# Patient Record
Sex: Female | Born: 1965
Health system: Southern US, Community
[De-identification: ages and names within clinical notes are randomized; demographics above are authoritative.]

## PROBLEM LIST (undated history)

## (undated) DIAGNOSIS — M47816 Spondylosis without myelopathy or radiculopathy, lumbar region: Secondary | ICD-10-CM

## (undated) DIAGNOSIS — G43909 Migraine, unspecified, not intractable, without status migrainosus: Secondary | ICD-10-CM

## (undated) DIAGNOSIS — K219 Gastro-esophageal reflux disease without esophagitis: Secondary | ICD-10-CM

## (undated) DIAGNOSIS — I10 Essential (primary) hypertension: Secondary | ICD-10-CM

## (undated) DIAGNOSIS — E785 Hyperlipidemia, unspecified: Secondary | ICD-10-CM

## (undated) DIAGNOSIS — T7840XA Allergy, unspecified, initial encounter: Secondary | ICD-10-CM

## (undated) HISTORY — DX: Allergy, unspecified, initial encounter: T78.40XA

## (undated) HISTORY — DX: Hyperlipidemia, unspecified: E78.5

## (undated) HISTORY — DX: Spondylosis without myelopathy or radiculopathy, lumbar region: M47.816

## (undated) HISTORY — DX: Essential (primary) hypertension: I10

## (undated) HISTORY — DX: Gastro-esophageal reflux disease without esophagitis: K21.9

## (undated) HISTORY — DX: Migraine, unspecified, not intractable, without status migrainosus: G43.909

---

## 2017-02-28 ENCOUNTER — Observation Stay (HOSPITAL_COMMUNITY)
Admission: EM | Admit: 2017-02-28 | Discharge: 2017-03-01 | Disposition: A | Discharge status: Home / Self Care | Payer: BLUE CROSS/BLUE SHIELD | Attending: Internal Medicine | Admitting: Internal Medicine

## 2017-02-28 ENCOUNTER — Observation Stay (HOSPITAL_COMMUNITY): Payer: BLUE CROSS/BLUE SHIELD

## 2017-02-28 ENCOUNTER — Emergency Department (HOSPITAL_COMMUNITY): Payer: BLUE CROSS/BLUE SHIELD

## 2017-02-28 ENCOUNTER — Encounter: Payer: Self-pay | Admitting: Emergency Medicine

## 2017-02-28 DIAGNOSIS — E785 Hyperlipidemia, unspecified: Secondary | ICD-10-CM | POA: Insufficient documentation

## 2017-02-28 DIAGNOSIS — G43409 Hemiplegic migraine, not intractable, without status migrainosus: Secondary | ICD-10-CM | POA: Diagnosis not present

## 2017-02-28 DIAGNOSIS — I10 Essential (primary) hypertension: Secondary | ICD-10-CM | POA: Diagnosis not present

## 2017-02-28 DIAGNOSIS — Z7982 Long term (current) use of aspirin: Secondary | ICD-10-CM | POA: Insufficient documentation

## 2017-02-28 DIAGNOSIS — F1010 Alcohol abuse, uncomplicated: Secondary | ICD-10-CM | POA: Diagnosis not present

## 2017-02-28 DIAGNOSIS — E782 Mixed hyperlipidemia: Secondary | ICD-10-CM | POA: Diagnosis not present

## 2017-02-28 DIAGNOSIS — I639 Cerebral infarction, unspecified: Secondary | ICD-10-CM

## 2017-02-28 DIAGNOSIS — R531 Weakness: Secondary | ICD-10-CM | POA: Diagnosis present

## 2017-02-28 DIAGNOSIS — Z79899 Other long term (current) drug therapy: Secondary | ICD-10-CM | POA: Insufficient documentation

## 2017-02-28 DIAGNOSIS — G459 Transient cerebral ischemic attack, unspecified: Secondary | ICD-10-CM

## 2017-02-28 DIAGNOSIS — G43109 Migraine with aura, not intractable, without status migrainosus: Secondary | ICD-10-CM | POA: Diagnosis not present

## 2017-02-28 DIAGNOSIS — F17200 Nicotine dependence, unspecified, uncomplicated: Secondary | ICD-10-CM | POA: Diagnosis not present

## 2017-02-28 DIAGNOSIS — G8190 Hemiplegia, unspecified affecting unspecified side: Secondary | ICD-10-CM | POA: Diagnosis not present

## 2017-02-28 DIAGNOSIS — G8194 Hemiplegia, unspecified affecting left nondominant side: Secondary | ICD-10-CM | POA: Diagnosis not present

## 2017-02-28 LAB — COMPREHENSIVE METABOLIC PANEL
ALBUMIN: 4.6 g/dL (ref 3.5–5.0)
ALK PHOS: 58 U/L (ref 38–126)
ALT: 26 U/L (ref 14–54)
AST: 23 U/L (ref 15–41)
Anion gap: 11 (ref 5–15)
BUN: 10 mg/dL (ref 6–20)
CALCIUM: 9.6 mg/dL (ref 8.9–10.3)
CO2: 26 mmol/L (ref 22–32)
CREATININE: 0.74 mg/dL (ref 0.44–1.00)
Chloride: 100 mmol/L — ABNORMAL LOW (ref 101–111)
GFR calc Af Amer: >60 mL/min (ref >60)
GFR calc non Af Amer: >60 mL/min (ref >60)
GLUCOSE: 101 mg/dL — AB (ref 65–99)
Potassium: 4 mmol/L (ref 3.5–5.1)
SODIUM: 137 mmol/L (ref 135–145)
Total Bilirubin: 0.5 mg/dL (ref 0.3–1.2)
Total Protein: 7.4 g/dL (ref 6.5–8.1)

## 2017-02-28 LAB — URINALYSIS, ROUTINE W REFLEX MICROSCOPIC
BILIRUBIN URINE: NEGATIVE
Glucose, UA: NEGATIVE mg/dL
Hgb urine dipstick: NEGATIVE
KETONES UR: NEGATIVE mg/dL
Leukocytes, UA: NEGATIVE
NITRITE: NEGATIVE
PH: 7 (ref 5.0–8.0)
Protein, ur: NEGATIVE mg/dL
Specific Gravity, Urine: 1.004 — ABNORMAL LOW (ref 1.005–1.030)

## 2017-02-28 LAB — DIFFERENTIAL
Basophils Absolute: 0 10*3/uL (ref 0.0–0.1)
Basophils Relative: 1 %
Eosinophils Absolute: 0.2 10*3/uL (ref 0.0–0.7)
Eosinophils Relative: 3 %
LYMPHS PCT: 46 %
Lymphs Abs: 2.9 10*3/uL (ref 0.7–4.0)
Monocytes Absolute: 0.5 10*3/uL (ref 0.1–1.0)
Monocytes Relative: 8 %
NEUTROS ABS: 2.7 10*3/uL (ref 1.7–7.7)
NEUTROS PCT: 42 %

## 2017-02-28 LAB — CBC
HCT: 45.7 % (ref 36.0–46.0)
Hemoglobin: 15.8 g/dL — ABNORMAL HIGH (ref 12.0–15.0)
MCH: 31.8 pg (ref 26.0–34.0)
MCHC: 34.6 g/dL (ref 30.0–36.0)
MCV: 92 fL (ref 78.0–100.0)
PLATELETS: 237 10*3/uL (ref 150–400)
RBC: 4.97 MIL/uL (ref 3.87–5.11)
RDW: 12.2 % (ref 11.5–15.5)
WBC: 6.4 10*3/uL (ref 4.0–10.5)

## 2017-02-28 LAB — I-STAT CHEM 8, ED
BUN: 9 mg/dL (ref 6–20)
CALCIUM ION: 1.18 mmol/L (ref 1.15–1.40)
CREATININE: 0.7 mg/dL (ref 0.44–1.00)
Chloride: 103 mmol/L (ref 101–111)
Glucose, Bld: 99 mg/dL (ref 65–99)
HEMATOCRIT: 46 % (ref 36.0–46.0)
HEMOGLOBIN: 15.6 g/dL — AB (ref 12.0–15.0)
Potassium: 3.9 mmol/L (ref 3.5–5.1)
SODIUM: 139 mmol/L (ref 135–145)
TCO2: 26 mmol/L (ref 22–32)

## 2017-02-28 LAB — URINALYSIS, COMPLETE (UACMP) WITH MICROSCOPIC
BACTERIA UA: NONE SEEN
BILIRUBIN URINE: NEGATIVE
Glucose, UA: NEGATIVE mg/dL
HGB URINE DIPSTICK: NEGATIVE
Ketones, ur: NEGATIVE mg/dL
Leukocytes, UA: NEGATIVE
NITRITE: NEGATIVE
PROTEIN: NEGATIVE mg/dL
Specific Gravity, Urine: 1.006 (ref 1.005–1.030)
pH: 8 (ref 5.0–8.0)

## 2017-02-28 LAB — RAPID URINE DRUG SCREEN, HOSP PERFORMED
AMPHETAMINES: NOT DETECTED
Barbiturates: NOT DETECTED
Benzodiazepines: NOT DETECTED
Cocaine: NOT DETECTED
OPIATES: NOT DETECTED
TETRAHYDROCANNABINOL: NOT DETECTED

## 2017-02-28 LAB — I-STAT TROPONIN, ED: Troponin i, poc: 0 ng/mL (ref 0.00–0.08)

## 2017-02-28 LAB — ETHANOL: Alcohol, Ethyl (B): <10 mg/dL (ref <10)

## 2017-02-28 LAB — PROTIME-INR
INR: 0.95
PROTHROMBIN TIME: 12.6 s (ref 11.4–15.2)

## 2017-02-28 LAB — APTT: aPTT: 26 seconds (ref 24–36)

## 2017-02-28 LAB — I-STAT BETA HCG BLOOD, ED (MC, WL, AP ONLY): I-stat hCG, quantitative: 6 m[IU]/mL — ABNORMAL HIGH (ref <5)

## 2017-02-28 MED ORDER — ASPIRIN 300 MG RE SUPP: 300.0000 mg | Freq: Every day | RECTAL | Status: DC

## 2017-02-28 MED ORDER — THIAMINE HCL 100 MG/ML IJ SOLN: 100.0000 mg | Freq: Every day | INTRAMUSCULAR | Status: DC

## 2017-02-28 MED ORDER — VITAMIN B-1 100 MG PO TABS
100.0000 mg | ORAL_TABLET | Freq: Every day | ORAL | Status: DC
  Administered 2017-02-28 – 2017-03-01 (×2): 100 mg via ORAL
  Filled 2017-02-28 (×2): qty 1

## 2017-02-28 MED ORDER — LORATADINE 10 MG PO TABS
10.0000 mg | ORAL_TABLET | Freq: Every day | ORAL | Status: DC
  Administered 2017-02-28 – 2017-03-01 (×2): 10 mg via ORAL
  Filled 2017-02-28 (×2): qty 1

## 2017-02-28 MED ORDER — SENNOSIDES-DOCUSATE SODIUM 8.6-50 MG PO TABS: 1.0000 | ORAL_TABLET | Freq: Every evening | ORAL | Status: DC | PRN

## 2017-02-28 MED ORDER — HYDRALAZINE HCL 20 MG/ML IJ SOLN: 10.0000 mg | Freq: Four times a day (QID) | INTRAMUSCULAR | Status: DC | PRN

## 2017-02-28 MED ORDER — ACETAMINOPHEN 325 MG PO TABS
650.0000 mg | ORAL_TABLET | ORAL | Status: DC | PRN
  Administered 2017-02-28 – 2017-03-01 (×2): 650 mg via ORAL
  Filled 2017-02-28 (×2): qty 2

## 2017-02-28 MED ORDER — FOLIC ACID 1 MG PO TABS
1.0000 mg | ORAL_TABLET | Freq: Every day | ORAL | Status: DC
  Administered 2017-02-28 – 2017-03-01 (×2): 1 mg via ORAL
  Filled 2017-02-28 (×2): qty 1

## 2017-02-28 MED ORDER — ASPIRIN 325 MG PO TABS: 325.0000 mg | ORAL_TABLET | Freq: Every day | ORAL | Status: DC

## 2017-02-28 MED ORDER — ENOXAPARIN SODIUM 40 MG/0.4ML ~~LOC~~ SOLN
40.0000 mg | SUBCUTANEOUS | Status: DC
  Administered 2017-02-28: 40 mg via SUBCUTANEOUS
  Filled 2017-02-28: qty 0.4

## 2017-02-28 MED ORDER — HYDRALAZINE HCL 20 MG/ML IJ SOLN
5.0000 mg | Freq: Once | INTRAMUSCULAR | Status: AC
  Administered 2017-02-28: 5 mg via INTRAVENOUS
  Filled 2017-02-28: qty 1

## 2017-02-28 MED ORDER — ASPIRIN 325 MG PO TABS
325.0000 mg | ORAL_TABLET | Freq: Every day | ORAL | Status: DC
  Administered 2017-02-28 – 2017-03-01 (×2): 325 mg via ORAL
  Filled 2017-02-28 (×2): qty 1

## 2017-02-28 MED ORDER — LORAZEPAM 2 MG/ML IJ SOLN: 1.0000 mg | Freq: Four times a day (QID) | INTRAMUSCULAR | Status: DC | PRN

## 2017-02-28 MED ORDER — PANTOPRAZOLE SODIUM 40 MG PO TBEC
40.0000 mg | DELAYED_RELEASE_TABLET | Freq: Every day | ORAL | Status: DC
  Administered 2017-03-01: 40 mg via ORAL
  Filled 2017-02-28 (×2): qty 1

## 2017-02-28 MED ORDER — ADULT MULTIVITAMIN W/MINERALS CH
1.0000 | ORAL_TABLET | Freq: Every day | ORAL | Status: DC
  Administered 2017-02-28 – 2017-03-01 (×2): 1 via ORAL
  Filled 2017-02-28 (×2): qty 1

## 2017-02-28 MED ORDER — ACETAMINOPHEN 160 MG/5ML PO SOLN: 650.0000 mg | ORAL | Status: DC | PRN

## 2017-02-28 MED ORDER — ACETAMINOPHEN 650 MG RE SUPP: 650.0000 mg | RECTAL | Status: DC | PRN

## 2017-02-28 MED ORDER — LORAZEPAM 1 MG PO TABS: 1.0000 mg | ORAL_TABLET | Freq: Four times a day (QID) | ORAL | Status: DC | PRN

## 2017-02-28 MED ORDER — STROKE: EARLY STAGES OF RECOVERY BOOK
Freq: Once | Status: AC
  Administered 2017-02-28: 17:00:00
  Filled 2017-02-28: qty 1

## 2017-02-28 MED ORDER — SODIUM CHLORIDE 0.9 % IV SOLN
INTRAVENOUS | Status: DC
  Administered 2017-02-28: 18:00:00 via INTRAVENOUS

## 2017-02-28 MED ORDER — LORAZEPAM 2 MG/ML IJ SOLN: 2.0000 mg | Freq: Once | INTRAMUSCULAR | Status: DC

## 2017-02-28 MED ORDER — MELATONIN 3 MG PO TABS: 3.0000 mg | ORAL_TABLET | Freq: Every day | ORAL | Status: DC

## 2017-02-28 MED ORDER — ASPIRIN 325 MG PO TABS: 325.0000 mg | ORAL_TABLET | Freq: Once | ORAL | Status: DC

## 2017-02-28 NOTE — ED Notes (Signed)
Gave patient drink as requested and approved by Dr Estell HarpinZammit.

## 2017-02-28 NOTE — Progress Notes (Signed)

## 2017-02-28 NOTE — ED Notes (Signed)
Patient complains of headache. Gave cool rag, let the back of the bed down, and cut off lights as requested by patient.

## 2017-02-28 NOTE — H&P (Signed)
History and Physical  Alexis [name] BJY:[id number] DOB: 1966/01/03 DOA: 02/28/2017   PCP: Patient, No Pcp Per   Patient coming from: Home  Chief Complaint: left arm heaviness  HPI:  Alexis [name] is a 51 y.o. female with no documented past medical history presented with left arm heaviness and numbness from her shoulder to her fingers that started around 10:30 AM on February 28, 2017.  The patient was at work sitting at her desk at the time.  This was associated with an occipital headache.  The patient tried to take some Excedrin without relief.  The patient had some dizziness, but denied any fevers, chills, chest pain, shortness breath, nausea, vomiting.  The patient also complained of some blurry vision, but she did denied dysphasia, dysarthria.  The patient also endorsed "wobbly legs".  She stated that it felt like her left leg was a little weak.  The patient actually drove home and told her significant other about her symptoms.  Subsequently, she was driven by private vehicle to the emergency department around 1 PM.  In the emergency department, the patient stated that her left arm heaviness was 50% better.  She still had difficulty ambulating secondary to her left leg feeling wobbly and weak. Teleneurology recommended admission for stroke work up, but did not feel patient was a tPA candidate. She has not seen a physician in over 2 years, and she states that she does not take any prescription medications.  She drinks 1 beer at a glass of wine daily.  She denies any illegal drug use or tobacco.  She has approximately a 5-pack-year history of tobacco.  She denies any dysuria, hematuria, hematochezia, melena, coughing, hemoptysis, abdominal pain.  In the emergency department, the patient was afebrile hemodynamically stable saturating 97% on room air.  Initial blood pressure was 183/125.  BMP, LFTs, and CBC were unremarkable.  CT of the brain was unremarkable.  EKG showed sinus rhythm with  nonspecific T wave changes.  In the emergency department, the patient was given aspirin 325 mg and hydralazine 5 mg IV  Assessment/Plan: Left hemiparesis -concerned about stroke vs complicated migraine -PT/OT evaluation -Speech therapy eval -CT brain-- -MRI brain-- -MRA brain-- -Carotid Duplex-- -Echo-- -LDL-- -HbA1C-- -Antiplatelet--ASA 325 mg daily  Essential hypertension -Allow permissive hypertension in the first 24 hours -Hydralazine prn SBP >220 -Urine drug screen--negative  Habitual Alcohol use -CIWA           History reviewed. No pertinent past medical history. History reviewed. No pertinent surgical history. Social History:  reports that she has never smoked. She has never used smokeless tobacco. She reports that she drinks alcohol. She reports that she does not use drugs.   Family history- sister and mother had stroke  Allergies  Allergen Reactions  . Codeine Hives  . Penicillins Hives    Has patient had a PCN reaction causing immediate rash, facial/tongue/throat swelling, SOB or lightheadedness with hypotension: Yes Has patient had a PCN reaction causing severe rash involving mucus membranes or skin necrosis: No Has patient had a PCN reaction that required hospitalization: No Has patient had a PCN reaction occurring within the last 10 years: No If all of the above answers are "NO", then may proceed with Cephalosporin use.   . Tramadol Itching  . Azithromycin Hives  . Tetracyclines & Related Nausea And Vomiting     Prior to Admission medications   Medication Sig Start Date End Date Taking? Authorizing Provider  fexofenadine (ALLEGRA) 180 MG tablet  Take 180 mg by mouth daily as needed for allergies.   Yes [provider]  Lactobacillus (PROBIOTIC ACIDOPHILUS PO) Take 1 capsule by mouth daily.   Yes [provider]  Melatonin 3 MG TABS Take 3 mg by mouth at bedtime.   Yes [provider]  omeprazole (PRILOSEC) 20 MG  capsule Take 20 mg by mouth daily.   Yes [provider]    Review of Systems:  Constitutional:  No weight loss, night sweats, Fevers, chills, fatigue.  Head&Eyes: No headache.  No vision loss.  No eye pain or scotoma ENT:  No Difficulty swallowing,Tooth/dental problems,Sore throat,  No ear ache, post nasal drip,  Cardio-vascular:  No chest pain, Orthopnea, PND, swelling in lower extremities,  dizziness, palpitations  GI:  No  abdominal pain, nausea, vomiting, diarrhea, loss of appetite, hematochezia, melena, heartburn, indigestion, Resp:  No shortness of breath with exertion or at rest. No cough. No coughing up of blood .No wheezing.No chest wall deformity  Skin:  no rash or lesions.  GU:  no dysuria, change in color of urine, no urgency or frequency. No flank pain.  Musculoskeletal:  No joint pain or swelling. No decreased range of motion. No back pain.  Psych:  No change in mood or affect. No depression or anxiety. Neurologic:  no vision loss. No syncope  Physical Exam: Vitals:   02/28/17 1345 02/28/17 1400 02/28/17 1445 02/28/17 1500  BP: (!) 147/110 (!) 166/117 (!) 160/108 (!) 154/105  Pulse: 82 80 96 86  Resp: (!) 21 11 (!) 21 (!) 23  Temp:      TempSrc:      SpO2: 94% 96% 97% 97%  Weight:      Height:       General:  A&O x 3, NAD, nontoxic, pleasant/cooperative Head/Eye: No conjunctival hemorrhage, no icterus, [place]/AT, No nystagmus ENT:  No icterus,  No thrush, good dentition, no pharyngeal exudate Neck:  No masses, no lymphadenpathy, no bruits CV:  RRR, no rub, no gallop, no S3 Lung:  CTAB, good air movement, no wheeze, no rhonchi Abdomen: soft/NT, +BS, nondistended, no peritoneal signs Ext: No cyanosis, No rashes, No petechiae, No lymphangitis, No edema Neuro: CNII-XII intact, strength 4-/5 in bilateral upper, 4-/5 LLE, 4+/5 RLE; no dysmetria  Labs on Admission:  Basic Metabolic Panel:  Recent Labs Lab 02/28/17 1304 02/28/17 1312  NA 137 139    K 4.0 3.9  CL 100* 103  CO2 26  --   GLUCOSE 101* 99  BUN 10 9  CREATININE 0.74 0.70  CALCIUM 9.6  --    Liver Function Tests:  Recent Labs Lab 02/28/17 1304  AST 23  ALT 26  ALKPHOS 58  BILITOT 0.5  PROT 7.4  ALBUMIN 4.6   No results for input(s): LIPASE, AMYLASE in the last 168 hours. No results for input(s): AMMONIA in the last 168 hours. CBC:  Recent Labs Lab 02/28/17 1304 02/28/17 1312  WBC 6.4  --   NEUTROABS 2.7  --   HGB 15.8* 15.6*  HCT 45.7 46.0  MCV 92.0  --   PLT 237  --    Coagulation Profile:  Recent Labs Lab 02/28/17 1304  INR 0.95   Cardiac Enzymes: No results for input(s): CKTOTAL, CKMB, CKMBINDEX, TROPONINI in the last 168 hours. BNP: Invalid input(s): POCBNP CBG: No results for input(s): GLUCAP in the last 168 hours. Urine analysis: No results found for: COLORURINE, APPEARANCEUR, LABSPEC, PHURINE, GLUCOSEU, HGBUR, BILIRUBINUR, KETONESUR, PROTEINUR, UROBILINOGEN, NITRITE, LEUKOCYTESUR Sepsis  Labs: @LABRCNTIP (procalcitonin:4,lacticidven:4) )No results found for this or any previous visit (from the past 240 hour(s)).   Radiological Exams on Admission: Ct Head Code Stroke Wo Contrast  Result Date: 02/28/2017 CLINICAL DATA:  Code stroke. New onset of left arm numbness and left lower extremity weakness. Lightheadedness and nausea. Symptoms began 2.5 hours ago. EXAM: CT HEAD WITHOUT CONTRAST TECHNIQUE: Contiguous axial images were obtained from the base of the skull through the vertex without intravenous contrast. COMPARISON:  None. FINDINGS: Brain: No acute infarct, hemorrhage, or mass lesion is present. The ventricles are of normal size. No significant extraaxial fluid collection is present. No significant white matter disease is present. The brainstem and cerebellum are normal. Vascular: No hyperdense vessel or unexpected calcification. Skull: No focal lytic or blastic lesions are present. The calvarium is intact. Sinuses/Orbits: The  paranasal sinuses and mastoid air cells are clear. The globes and orbits are within normal limits. ASPECTS [facility] Stroke Program Early CT Score) - Ganglionic level infarction (caudate, lentiform nuclei, internal capsule, insula, M1-M3 cortex): 7/7 - Supraganglionic infarction (M4-M6 cortex): 3/3 Total score (0-10 with 10 being normal): 10/10 IMPRESSION: 1. Negative CT of the head 2. ASPECTS is 10/10 These results were called by telephone at the time of interpretation on 02/28/2017 at 1:24 pm to Dr. Bethann Berkshire , who verbally acknowledged these results. Electronically Signed   By: Marin Roberts M.D.   On: 02/28/2017 13:25    EKG: Independently reviewed.  Sinus rhythm, nonspecific T wave change    Time spent:60 minutes Code Status:   FULL Family Communication:  No Family at bedside Disposition Plan: expect 1-2 day hospitalization Consults called: neurology DVT Prophylaxis: [place] Lovenox  Watson Robarge, DO  [facility] Pager [phone]  If 7PM-7AM, please contact night-coverage [email or web address] Password [email or web address] 02/28/2017, 3:17 PM

## 2017-02-28 NOTE — Progress Notes (Signed)
CODE STROKE 1255 CALL TIME 1305 BEEPER TIME 1307 EXAM STARTED 1309 EXAM FINISHED 1313 IMAGES SENT TO SOC 1314 EXAM COMPLETED IN EPIC 1314 [facility] CALLED

## 2017-02-28 NOTE — ED Notes (Signed)
Patient states she took 2 excedrin migraine medication this morning at 0930. Patient had bottle with her and it states the medication contains 250 mg of aspirin per tablet. Per Dr Aileen PilotZammitt, patient not to be given 325 mg aspirin.

## 2017-02-28 NOTE — ED Notes (Signed)
Patient ambulated to bathroom with this nurse at side. Patient ambulated with no assistance or difficulty. States "It's better but I can tell it's not 100% yet." Patient returned to room and reconnected to monitor. Spouse remains at bedside.

## 2017-02-28 NOTE — ED Provider Notes (Signed)
[facility] Provider Note   CSN: [id number] Arrival date & time: 02/28/17  1247   An emergency department physician performed an initial assessment on this suspected stroke patient at 1305.  History   Chief Complaint Chief Complaint  Patient presents with  . Code Stroke    HPI Alexis [name] is a 51 y.o. female.  Patient states that around 1030 this morning she started with weakness to the left upper extremity.  She also stated that she felt like she was walking little bit wobbly   The history is provided by the patient. No language interpreter was used.  Weakness  Primary symptoms include focal weakness. This is a new problem. The current episode started 1 to 2 hours ago. The problem has not changed since onset.There was left lower extremity and left upper extremity focality noted. There has been no fever. Pertinent negatives include no shortness of breath, no chest pain and no headaches. There were no medications administered prior to arrival. Associated medical issues do not include trauma.    History reviewed. No pertinent past medical history.  Patient Active Problem List   Diagnosis Date Noted  . Left hemiparesis (HCC) 02/28/2017    History reviewed. No pertinent surgical history.  OB History    No data available       Home Medications    Prior to Admission medications   Medication Sig Start Date End Date Taking? Authorizing Provider  fexofenadine (ALLEGRA) 180 MG tablet Take 180 mg by mouth daily as needed for allergies.   Yes [provider]  Lactobacillus (PROBIOTIC ACIDOPHILUS PO) Take 1 capsule by mouth daily.   Yes [provider]  Melatonin 3 MG TABS Take 3 mg by mouth at bedtime.   Yes [provider]  omeprazole (PRILOSEC) 20 MG capsule Take 20 mg by mouth daily.   Yes [provider]    Family History No family history on file.  Social History Social History  Substance Use Topics  .  Smoking status: Never Smoker  . Smokeless tobacco: Never Used  . Alcohol use Yes     Comment: daily      Allergies   Codeine; Penicillins; Tramadol; Azithromycin; and Tetracyclines & related   Review of Systems Review of Systems  Constitutional: Negative for appetite change and fatigue.  HENT: Negative for congestion, ear discharge and sinus pressure.   Eyes: Negative for discharge.  Respiratory: Negative for cough and shortness of breath.   Cardiovascular: Negative for chest pain.  Gastrointestinal: Negative for abdominal pain and diarrhea.  Genitourinary: Negative for frequency and hematuria.  Musculoskeletal: Negative for back pain.  Skin: Negative for rash.  Neurological: Positive for focal weakness and weakness. Negative for seizures and headaches.  Psychiatric/Behavioral: Negative for hallucinations.     Physical Exam Updated Vital Signs BP (!) 154/105   Pulse 86   Temp 97.6 F (36.4 C)   Resp (!) 23   Ht 5\' 4"  (1.626 m)   Wt 68 kg (150 lb)   SpO2 97%   BMI 25.75 kg/m   Physical Exam  Constitutional: She is oriented to person, place, and time. She appears well-developed.  HENT:  Head: Normocephalic.  Eyes: Conjunctivae and EOM are normal. No scleral icterus.  Neck: Neck supple. No thyromegaly present.  Cardiovascular: Normal rate and regular rhythm.  Exam reveals no gallop and no friction rub.   No murmur heard. Pulmonary/Chest: No stridor. She has no wheezes. She has no rales. She exhibits no tenderness.  Abdominal: She exhibits no distension. There is no tenderness. There is no rebound.  Musculoskeletal: Normal range of motion. She exhibits no edema.  Patient has mild decreased sensation to the left forearm also mild ataxia when walking heel to toe on the left leg  Lymphadenopathy:    She has no cervical adenopathy.  Neurological: She is oriented to person, place, and time. She exhibits normal muscle tone. Coordination normal.  Skin: No rash noted. No  erythema.  Psychiatric: She has a normal mood and affect. Her behavior is normal.     ED Treatments / Results  Labs (all labs ordered are listed, but only abnormal results are displayed) Labs Reviewed  CBC - Abnormal; Notable for the following:       Result Value   Hemoglobin 15.8 (*)    All other components within normal limits  COMPREHENSIVE METABOLIC PANEL - Abnormal; Notable for the following:    Chloride 100 (*)    Glucose, Bld 101 (*)    All other components within normal limits  I-STAT CHEM 8, ED - Abnormal; Notable for the following:    Hemoglobin 15.6 (*)    All other components within normal limits  I-STAT BETA HCG BLOOD, ED ([facility], [facility], [facility] ONLY) - Abnormal; Notable for the following:    I-stat hCG, quantitative 6.0 (*)    All other components within normal limits  ETHANOL  PROTIME-INR  APTT  DIFFERENTIAL  RAPID URINE DRUG SCREEN, HOSP PERFORMED  URINALYSIS, ROUTINE W REFLEX MICROSCOPIC  I-STAT TROPONIN, ED    EKG  EKG Interpretation None       Radiology Ct Head Code Stroke Wo Contrast  Result Date: 02/28/2017 CLINICAL DATA:  Code stroke. New onset of left arm numbness and left lower extremity weakness. Lightheadedness and nausea. Symptoms began 2.5 hours ago. EXAM: CT HEAD WITHOUT CONTRAST TECHNIQUE: Contiguous axial images were obtained from the base of the skull through the vertex without intravenous contrast. COMPARISON:  None. FINDINGS: Brain: No acute infarct, hemorrhage, or mass lesion is present. The ventricles are of normal size. No significant extraaxial fluid collection is present. No significant white matter disease is present. The brainstem and cerebellum are normal. Vascular: No hyperdense vessel or unexpected calcification. Skull: No focal lytic or blastic lesions are present. The calvarium is intact. Sinuses/Orbits: The paranasal sinuses and mastoid air cells are clear. The globes and orbits are within normal limits. ASPECTS [facility] Stroke Program  Early CT Score) - Ganglionic level infarction (caudate, lentiform nuclei, internal capsule, insula, M1-M3 cortex): 7/7 - Supraganglionic infarction (M4-M6 cortex): 3/3 Total score (0-10 with 10 being normal): 10/10 IMPRESSION: 1. Negative CT of the head 2. ASPECTS is 10/10 These results were called by telephone at the time of interpretation on 02/28/2017 at 1:24 pm to Dr. Bethann Berkshire , who verbally acknowledged these results. Electronically Signed   By: Marin Roberts M.D.   On: 02/28/2017 13:25    Procedures Procedures (including critical care time)  Medications Ordered in ED Medications  aspirin tablet 325 mg (325 mg Oral Not Given 02/28/17 1342)  hydrALAZINE (APRESOLINE) injection 5 mg (5 mg Intravenous Given 02/28/17 1457)     Initial Impression / Assessment and Plan / ED Course  I have reviewed the triage vital signs and the nursing notes.  Pertinent labs & imaging results that were available during my care of the patient were reviewed by me and considered in my medical decision making (see chart for details). CRITICAL CARE Performed by: Ardra Kuznicki L Total critical  care time: 40 minutes Critical care time was exclusive of separately billable procedures and treating other patients. Critical care was necessary to treat or prevent imminent or life-threatening deterioration. Critical care was time spent personally by me on the following activities: development of treatment plan with patient and/or surrogate as well as nursing, discussions with consultants, evaluation of patient's response to treatment, examination of patient, obtaining history from patient or surrogate, ordering and performing treatments and interventions, ordering and review of laboratory studies, ordering and review of radiographic studies, pulse oximetry and re-evaluation of patient's condition.     Neurology was consulted.  It was felt by neurology that the patient did not warrant TPA and they recommended  admission for stroke workup Final Clinical Impressions(s) / ED Diagnoses   Final diagnoses:  TIA (transient ischemic attack)  Left-sided weakness    New Prescriptions New Prescriptions   No medications on file     Bethann Berkshire, MD 02/28/17 1520

## 2017-02-28 NOTE — ED Notes (Signed)
Patient receiving teleneuro consult.

## 2017-02-28 NOTE — ED Notes (Signed)
Patient returned from CT

## 2017-02-28 NOTE — ED Notes (Signed)
Per Dr Estell HarpinZammit, patient not a candidate for TPA, code stroke ended at this time.

## 2017-02-28 NOTE — ED Notes (Signed)
Patient to CT.

## 2017-02-28 NOTE — ED Triage Notes (Signed)
Pt c/o left arm numbness, left leg feeling "wobbly", lightheadedness, nausea that started between 1030 and 1100 this morning. Dr. Estell HarpinZammit notified.

## 2017-03-01 ENCOUNTER — Encounter: Payer: Self-pay | Admitting: Neurology

## 2017-03-01 ENCOUNTER — Observation Stay (HOSPITAL_COMMUNITY): Payer: BLUE CROSS/BLUE SHIELD

## 2017-03-01 ENCOUNTER — Observation Stay (HOSPITAL_BASED_OUTPATIENT_CLINIC_OR_DEPARTMENT_OTHER): Payer: BLUE CROSS/BLUE SHIELD

## 2017-03-01 DIAGNOSIS — G8190 Hemiplegia, unspecified affecting unspecified side: Secondary | ICD-10-CM | POA: Diagnosis not present

## 2017-03-01 DIAGNOSIS — G43409 Hemiplegic migraine, not intractable, without status migrainosus: Secondary | ICD-10-CM | POA: Diagnosis not present

## 2017-03-01 DIAGNOSIS — I10 Essential (primary) hypertension: Secondary | ICD-10-CM

## 2017-03-01 DIAGNOSIS — E782 Mixed hyperlipidemia: Secondary | ICD-10-CM

## 2017-03-01 DIAGNOSIS — G43109 Migraine with aura, not intractable, without status migrainosus: Secondary | ICD-10-CM

## 2017-03-01 DIAGNOSIS — F1010 Alcohol abuse, uncomplicated: Secondary | ICD-10-CM | POA: Diagnosis not present

## 2017-03-01 DIAGNOSIS — G8194 Hemiplegia, unspecified affecting left nondominant side: Secondary | ICD-10-CM | POA: Diagnosis not present

## 2017-03-01 LAB — ECHOCARDIOGRAM COMPLETE
CHL CUP DOP CALC LVOT VTI: 21.4 cm
CHL CUP STROKE VOLUME: 26 mL
EERAT: 5.52
EWDT: 180 ms
FS: 36 % (ref 28–44)
Height: 64 in
IV/PV OW: 0.91
LA vol A4C: 31.2 ml
LADIAMINDEX: 1.64 cm/m2
LASIZE: 30 mm
LDCA: 3.14 cm2
LEFT ATRIUM END SYS DIAM: 30 mm
LV E/e' medial: 5.52
LV E/e'average: 5.52
LV TDI E'MEDIAL: 7.07
LV dias vol index: 20 mL/m2
LV sys vol index: 6 mL/m2
LV sys vol: 11 mL — AB
LVDIAVOL: 37 mL — AB (ref 46–106)
LVELAT: 12.2 cm/s
LVOT SV: 67 mL
LVOT diameter: 20 mm
LVOT peak grad rest: 6 mmHg
LVOT peak vel: 118 cm/s
Lateral S' vel: 13.7 cm/s
MV Dec: 180
MVPKAVEL: 96.8 m/s
MVPKEVEL: 67.3 m/s
PW: 10 mm — AB (ref 0.6–1.1)
Simpson's disk: 70
TAPSE: 13.8 mm
TDI e' lateral: 12.2
Weight: 2567 oz

## 2017-03-01 LAB — HEMOGLOBIN A1C
Hgb A1c MFr Bld: 5.5 % (ref 4.8–5.6)
Mean Plasma Glucose: 111.15 mg/dL

## 2017-03-01 LAB — LIPID PANEL
CHOL/HDL RATIO: 5.5 ratio
Cholesterol: 253 mg/dL — ABNORMAL HIGH (ref 0–200)
HDL: 46 mg/dL (ref >40)
LDL CALC: UNDETERMINED mg/dL (ref 0–99)
TRIGLYCERIDES: 427 mg/dL — AB (ref <150)
VLDL: UNDETERMINED mg/dL (ref 0–40)

## 2017-03-01 MED ORDER — AMLODIPINE BESYLATE 10 MG PO TABS: 10.0000 mg | ORAL_TABLET | Freq: Every day | ORAL | 1 refills | Status: DC

## 2017-03-01 MED ORDER — FENOFIBRATE 160 MG PO TABS: 160.0000 mg | ORAL_TABLET | Freq: Every day | ORAL | 1 refills | Status: DC

## 2017-03-01 MED ORDER — ATORVASTATIN CALCIUM 20 MG PO TABS: 20.0000 mg | ORAL_TABLET | Freq: Every day | ORAL | 1 refills | Status: DC

## 2017-03-01 MED ORDER — AMLODIPINE BESYLATE 5 MG PO TABS
5.0000 mg | ORAL_TABLET | Freq: Every day | ORAL | Status: DC
  Administered 2017-03-01: 5 mg via ORAL
  Filled 2017-03-01: qty 1

## 2017-03-01 MED ORDER — ATORVASTATIN CALCIUM 20 MG PO TABS: 20.0000 mg | ORAL_TABLET | Freq: Every day | ORAL | Status: DC

## 2017-03-01 MED ORDER — ASPIRIN EC 81 MG PO TBEC: 81.0000 mg | DELAYED_RELEASE_TABLET | Freq: Every day | ORAL | 1 refills | Status: AC

## 2017-03-01 MED ORDER — FENOFIBRATE 160 MG PO TABS
160.0000 mg | ORAL_TABLET | Freq: Every day | ORAL | Status: DC
  Administered 2017-03-01: 160 mg via ORAL
  Filled 2017-03-01: qty 1

## 2017-03-01 MED ORDER — AMLODIPINE BESYLATE 5 MG PO TABS: 5.0000 mg | ORAL_TABLET | Freq: Every day | ORAL | 1 refills | Status: DC

## 2017-03-01 NOTE — Progress Notes (Signed)
Patient states understanding of discharge instructions, prescriptions given 

## 2017-03-01 NOTE — Progress Notes (Signed)
*  PRELIMINARY RESULTS* Echocardiogram 2D Echocardiogram has been performed.  Stacey DrainWhite, Zipporah Finamore J 03/01/2017, 11:46 AM

## 2017-03-01 NOTE — Care Management Note (Signed)
Case Management Note  Patient Details  Name: Alexis [name] MRN: [id number] Date of Birth: 07-21-1965  Subjective/Objective:           Admitted with left hemiparesis. CVA workup negative. Pt from home, ind, has insurance with drug coverage, no PCP. Pt has no PT/OT recommendations for DC. Pt has no PCP.          Action/Plan: DC home today. Pt given list of PCP in [place] accepting new pt's. Encouraged pt to establish care.   Expected Discharge Date:  03/02/17               Expected Discharge Plan:  Home/Self Care  In-House Referral:  NA  Discharge planning Services  CM Consult  Post Acute Care Choice:  NA Choice offered to:  NA  Status of Service:  Completed, signed off  Alexis [name], Girtie Wiersma Demske, RN 03/01/2017, 1:00 PM

## 2017-03-01 NOTE — Progress Notes (Signed)
Pt's BP was 149/100.  Dr Tat informed.  No new orders at this time.

## 2017-03-01 NOTE — Discharge Summary (Signed)
Physician Discharge Summary  Elmer SowMichele Farquharson WUJ:[id number] DOB: [birth year] DOA: 02/28/2017  PCP: Patient, No Pcp Per  Admit date: 02/28/2017 Discharge date: 03/01/2017  Admitted From: Home Disposition:  Home   Recommendations for Outpatient Follow-up:  1. Follow up with PCP in 1-2 weeks 2. Please obtain BMP/CBC in one week    Discharge Condition: Stable CODE STATUS: FULL Diet recommendation: Heart Healthy   Brief/Interim Summary: 51 y.o. female with no documented past medical history presented with left arm heaviness and numbness from her shoulder to her fingers that started around 10:30 AM on February 28, 2017.  The patient was at work sitting at her desk at the time.  This was associated with an occipital headache.  The patient tried to take some Excedrin without relief.  The patient had some dizziness, but denied any fevers, chills, chest pain, shortness breath, nausea, vomiting.  The patient also complained of some blurry vision, but she did denied dysphasia, dysarthria.  The patient also endorsed "wobbly legs".  She stated that it felt like her left leg was a little weak.  The patient actually drove home and told her significant other about her symptoms.  Subsequently, she was driven by private vehicle to the emergency department around 1 PM.  In the emergency department, the patient stated that her left arm heaviness was 50% better.  She still had difficulty ambulating secondary to her left leg feeling wobbly and weak. Teleneurology recommended admission for stroke work up, but did not feel patient was a tPA candidate. She has not seen a physician in over 2 years, and she states that she does not take any prescription medications.  She drinks 1 beer at a glass of wine daily.  She denies any illegal drug use or tobacco.  She has approximately a 5-pack-year history of tobacco.  She denies any dysuria, hematuria, hematochezia, melena, coughing, hemoptysis, abdominal pain.  In the  emergency department, the patient was afebrile hemodynamically stable saturating 97% on room air.  Initial blood pressure was 183/125.  BMP, LFTs, and CBC were unremarkable.  CT of the brain was unremarkable.  EKG showed sinus rhythm with nonspecific T wave changes.  In the emergency department, the patient was given aspirin 325 mg and hydralazine 5 mg IV  Discharge Diagnoses:  Complicated Migraine/Left hemiparesis -deficits completely resolved on am of discharge -PT/OT evaluation--no follow up needed -Speech therapy eval -CT brain--neg -MRI brain--neg -MRA brain--neg -Carotid Duplex--no hemodynamically significant stenosis -Echo--EF-65%, no WMA, grade 1 DD, no PFO -LDL--unable to calculate due to hypertryglyceridemia -HbA1C--5.5 -Antiplatelet--home with ASA 81 mg daily  Essential hypertension -Allow permissive hypertension in the first 24 hours -Hydralazine prn SBP >220 -Urine drug screen--negative -start amlodipine 10 mg daily  Habitual Alcohol use -CIWA -cessation discussed -no signs of withdrawal  Hyperlipidemia -fenofibrate for hypertriglyceridemia--427 -atorvastatin for hypercholesterolemia   Discharge Instructions  Discharge Instructions    Diet - low sodium heart healthy    Complete by:  As directed    Increase activity slowly    Complete by:  As directed      Allergies as of 03/01/2017      Reactions   Codeine Hives   Penicillins Hives   Has patient had a PCN reaction causing immediate rash, facial/tongue/throat swelling, SOB or lightheadedness with hypotension: Yes Has patient had a PCN reaction causing severe rash involving mucus membranes or skin necrosis: No Has patient had a PCN reaction that required hospitalization: No Has patient had a PCN reaction occurring within the last 10  years: No If all of the above answers are "NO", then may proceed with Cephalosporin use.   Tramadol Itching   Azithromycin Hives   Tetracyclines & Related Nausea And  Vomiting      Medication List    TAKE these medications   amLODipine 10 MG tablet Commonly known as:  NORVASC Take 1 tablet (10 mg total) by mouth daily.   aspirin EC 81 MG tablet Take 1 tablet (81 mg total) by mouth daily.   atorvastatin 20 MG tablet Commonly known as:  LIPITOR Take 1 tablet (20 mg total) by mouth daily at 6 PM.   fenofibrate 160 MG tablet Take 1 tablet (160 mg total) by mouth daily.   fexofenadine 180 MG tablet Commonly known as:  ALLEGRA Take 180 mg by mouth daily as needed for allergies.   Melatonin 3 MG Tabs Take 3 mg by mouth at bedtime.   omeprazole 20 MG capsule Commonly known as:  PRILOSEC Take 20 mg by mouth daily.   PROBIOTIC ACIDOPHILUS PO Take 1 capsule by mouth daily.       Allergies  Allergen Reactions  . Codeine Hives  . Penicillins Hives    Has patient had a PCN reaction causing immediate rash, facial/tongue/throat swelling, SOB or lightheadedness with hypotension: Yes Has patient had a PCN reaction causing severe rash involving mucus membranes or skin necrosis: No Has patient had a PCN reaction that required hospitalization: No Has patient had a PCN reaction occurring within the last 10 years: No If all of the above answers are "NO", then may proceed with Cephalosporin use.   . Tramadol Itching  . Azithromycin Hives  . Tetracyclines & Related Nausea And Vomiting    Consultations:  none   Procedures/Studies: Dg Chest 2 View  Result Date: 02/28/2017 CLINICAL DATA:  Preadmission radiograph for possible stroke. EXAM: CHEST  2 VIEW COMPARISON:  None. FINDINGS: The heart size and mediastinal contours are within normal limits. Both lungs are clear. The visualized skeletal structures are unremarkable. IMPRESSION: No active cardiopulmonary disease. Electronically Signed   By: Tollie Eth M.D.   On: 02/28/2017 19:56   Mr Brain Wo Contrast  Result Date: 02/28/2017 CLINICAL DATA:  51 y/o  F; left arm numbness. EXAM: MRI HEAD  WITHOUT CONTRAST MRA HEAD WITHOUT CONTRAST TECHNIQUE: Multiplanar, multiecho pulse sequences of the brain and surrounding structures were obtained without intravenous contrast. Angiographic images of the head were obtained using MRA technique without contrast. COMPARISON:  02/28/2017 CT of the head. FINDINGS: MRI HEAD FINDINGS Brain: No acute infarction, hemorrhage, hydrocephalus, extra-axial collection or mass lesion. Vascular: Normal flow voids. Skull and upper cervical spine: Normal marrow signal. Sinuses/Orbits: Negative. Other: None. MRA HEAD FINDINGS Anterior circulation: No large vessel occlusion, aneurysm, or significant stenosis is identified. Posterior circulation: No large vessel occlusion, aneurysm, or significant stenosis is identified. Anatomic variant: Patent anterior communicating artery. Fetal left PCA. No right posterior communicating artery identified, likely hypoplastic or absent. IMPRESSION: 1. No acute intracranial abnormality identified. 2. Unremarkable MRI of the brain. 3. Patent circle of Willis. No large vessel occlusion, aneurysm, or significant stenosis is identified. Electronically Signed   By: Mitzi Hansen M.D.   On: 02/28/2017 17:52   US Carotid Bilateral (at [facility] And Ap Only)  Result Date: 03/01/2017 CLINICAL DATA:  Left upper extremity heaviness and numbness for 1 day EXAM: BILATERAL CAROTID DUPLEX ULTRASOUND TECHNIQUE: Wallace Cullens scale imaging, color Doppler and duplex ultrasound were performed of bilateral carotid and vertebral arteries in the neck. COMPARISON:  None. FINDINGS: Criteria: Quantification of carotid stenosis is based on velocity parameters that correlate the residual internal carotid diameter with NASCET-based stenosis levels, using the diameter of the distal internal carotid lumen as the denominator for stenosis measurement. The following velocity measurements were obtained: RIGHT ICA:  72 cm/sec CCA:  92 cm/sec SYSTOLIC ICA/CCA RATIO:  0.8 DIASTOLIC  ICA/CCA RATIO:  0.9 ECA:  77 cm/sec LEFT ICA:  79 cm/sec CCA:  78 cm/sec SYSTOLIC ICA/CCA RATIO:  1.0 DIASTOLIC ICA/CCA RATIO:  1.6 ECA:  85 cm/sec RIGHT CAROTID ARTERY: Minimal mixed smooth plaque along the wall of the bulb. Low resistance internal carotid Doppler pattern is preserved. RIGHT VERTEBRAL ARTERY:  Antegrade. LEFT CAROTID ARTERY: Mild intimal thickening in the bulb. Low resistance internal carotid Doppler pattern is preserved. LEFT VERTEBRAL ARTERY:  Antegrade. IMPRESSION: Less than 50% stenosis in the right and left internal carotid arteries. Electronically Signed   By: Jolaine Click M.D.   On: 03/01/2017 09:12   Mr Maxine Glenn Head/brain ZO Cm  Result Date: 02/28/2017 CLINICAL DATA:  51 y/o  F; left arm numbness. EXAM: MRI HEAD WITHOUT CONTRAST MRA HEAD WITHOUT CONTRAST TECHNIQUE: Multiplanar, multiecho pulse sequences of the brain and surrounding structures were obtained without intravenous contrast. Angiographic images of the head were obtained using MRA technique without contrast. COMPARISON:  02/28/2017 CT of the head. FINDINGS: MRI HEAD FINDINGS Brain: No acute infarction, hemorrhage, hydrocephalus, extra-axial collection or mass lesion. Vascular: Normal flow voids. Skull and upper cervical spine: Normal marrow signal. Sinuses/Orbits: Negative. Other: None. MRA HEAD FINDINGS Anterior circulation: No large vessel occlusion, aneurysm, or significant stenosis is identified. Posterior circulation: No large vessel occlusion, aneurysm, or significant stenosis is identified. Anatomic variant: Patent anterior communicating artery. Fetal left PCA. No right posterior communicating artery identified, likely hypoplastic or absent. IMPRESSION: 1. No acute intracranial abnormality identified. 2. Unremarkable MRI of the brain. 3. Patent circle of Willis. No large vessel occlusion, aneurysm, or significant stenosis is identified. Electronically Signed   By: Mitzi Hansen M.D.   On: 02/28/2017 17:52   Ct  Head Code Stroke Wo Contrast  Result Date: 02/28/2017 CLINICAL DATA:  Code stroke. New onset of left arm numbness and left lower extremity weakness. Lightheadedness and nausea. Symptoms began 2.5 hours ago. EXAM: CT HEAD WITHOUT CONTRAST TECHNIQUE: Contiguous axial images were obtained from the base of the skull through the vertex without intravenous contrast. COMPARISON:  None. FINDINGS: Brain: No acute infarct, hemorrhage, or mass lesion is present. The ventricles are of normal size. No significant extraaxial fluid collection is present. No significant white matter disease is present. The brainstem and cerebellum are normal. Vascular: No hyperdense vessel or unexpected calcification. Skull: No focal lytic or blastic lesions are present. The calvarium is intact. Sinuses/Orbits: The paranasal sinuses and mastoid air cells are clear. The globes and orbits are within normal limits. ASPECTS [facility] Stroke Program Early CT Score) - Ganglionic level infarction (caudate, lentiform nuclei, internal capsule, insula, M1-M3 cortex): 7/7 - Supraganglionic infarction (M4-M6 cortex): 3/3 Total score (0-10 with 10 being normal): 10/10 IMPRESSION: 1. Negative CT of the head 2. ASPECTS is 10/10 These results were called by telephone at the time of interpretation on 02/28/2017 at 1:24 pm to Dr. Bethann Berkshire , who verbally acknowledged these results. Electronically Signed   By: Marin Roberts M.D.   On: 02/28/2017 13:25        Discharge Exam: Vitals:   03/01/17 1200 03/01/17 1220  BP: (!) 136/96 (!) 136/96  Pulse: 99 99  Resp:  18  Temp:  98.2 F (36.8 C)  SpO2:  99%   Vitals:   03/01/17 0420 03/01/17 0820 03/01/17 1200 03/01/17 1220  BP: (!) 147/97 (!) 149/100 (!) 136/96 (!) 136/96  Pulse: 84 96 99 99  Resp: 18 18  18   Temp: 98 F (36.7 C) 97.7 F (36.5 C)  98.2 F (36.8 C)  TempSrc:  Oral  Oral  SpO2: 96% 98%  99%  Weight:      Height:        General: Pt is alert, awake, not in acute  distress Cardiovascular: RRR, S1/S2 +, no rubs, no gallops Respiratory: CTA bilaterally, no wheezing, no rhonchi Abdominal: Soft, NT, ND, bowel sounds + Extremities: no edema, no cyanosis   The results of significant diagnostics from this hospitalization (including imaging, microbiology, ancillary and laboratory) are listed below for reference.    Significant Diagnostic Studies: Dg Chest 2 View  Result Date: 02/28/2017 CLINICAL DATA:  Preadmission radiograph for possible stroke. EXAM: CHEST  2 VIEW COMPARISON:  None. FINDINGS: The heart size and mediastinal contours are within normal limits. Both lungs are clear. The visualized skeletal structures are unremarkable. IMPRESSION: No active cardiopulmonary disease. Electronically Signed   By: Tollie Eth M.D.   On: 02/28/2017 19:56   Mr Brain Wo Contrast  Result Date: 02/28/2017 CLINICAL DATA:  51 y/o  F; left arm numbness. EXAM: MRI HEAD WITHOUT CONTRAST MRA HEAD WITHOUT CONTRAST TECHNIQUE: Multiplanar, multiecho pulse sequences of the brain and surrounding structures were obtained without intravenous contrast. Angiographic images of the head were obtained using MRA technique without contrast. COMPARISON:  02/28/2017 CT of the head. FINDINGS: MRI HEAD FINDINGS Brain: No acute infarction, hemorrhage, hydrocephalus, extra-axial collection or mass lesion. Vascular: Normal flow voids. Skull and upper cervical spine: Normal marrow signal. Sinuses/Orbits: Negative. Other: None. MRA HEAD FINDINGS Anterior circulation: No large vessel occlusion, aneurysm, or significant stenosis is identified. Posterior circulation: No large vessel occlusion, aneurysm, or significant stenosis is identified. Anatomic variant: Patent anterior communicating artery. Fetal left PCA. No right posterior communicating artery identified, likely hypoplastic or absent. IMPRESSION: 1. No acute intracranial abnormality identified. 2. Unremarkable MRI of the brain. 3. Patent circle of  Willis. No large vessel occlusion, aneurysm, or significant stenosis is identified. Electronically Signed   By: Mitzi Hansen M.D.   On: 02/28/2017 17:52   US Carotid Bilateral (at [facility] And Ap Only)  Result Date: 03/01/2017 CLINICAL DATA:  Left upper extremity heaviness and numbness for 1 day EXAM: BILATERAL CAROTID DUPLEX ULTRASOUND TECHNIQUE: Wallace Cullens scale imaging, color Doppler and duplex ultrasound were performed of bilateral carotid and vertebral arteries in the neck. COMPARISON:  None. FINDINGS: Criteria: Quantification of carotid stenosis is based on velocity parameters that correlate the residual internal carotid diameter with NASCET-based stenosis levels, using the diameter of the distal internal carotid lumen as the denominator for stenosis measurement. The following velocity measurements were obtained: RIGHT ICA:  72 cm/sec CCA:  92 cm/sec SYSTOLIC ICA/CCA RATIO:  0.8 DIASTOLIC ICA/CCA RATIO:  0.9 ECA:  77 cm/sec LEFT ICA:  79 cm/sec CCA:  78 cm/sec SYSTOLIC ICA/CCA RATIO:  1.0 DIASTOLIC ICA/CCA RATIO:  1.6 ECA:  85 cm/sec RIGHT CAROTID ARTERY: Minimal mixed smooth plaque along the wall of the bulb. Low resistance internal carotid Doppler pattern is preserved. RIGHT VERTEBRAL ARTERY:  Antegrade. LEFT CAROTID ARTERY: Mild intimal thickening in the bulb. Low resistance internal carotid Doppler pattern is preserved. LEFT VERTEBRAL ARTERY:  Antegrade. IMPRESSION: Less than 50% stenosis in  the right and left internal carotid arteries. Electronically Signed   By: Jolaine Click M.D.   On: 03/01/2017 09:12   Mr Maxine Glenn Head/brain ZO Cm  Result Date: 02/28/2017 CLINICAL DATA:  51 y/o  F; left arm numbness. EXAM: MRI HEAD WITHOUT CONTRAST MRA HEAD WITHOUT CONTRAST TECHNIQUE: Multiplanar, multiecho pulse sequences of the brain and surrounding structures were obtained without intravenous contrast. Angiographic images of the head were obtained using MRA technique without contrast. COMPARISON:  02/28/2017  CT of the head. FINDINGS: MRI HEAD FINDINGS Brain: No acute infarction, hemorrhage, hydrocephalus, extra-axial collection or mass lesion. Vascular: Normal flow voids. Skull and upper cervical spine: Normal marrow signal. Sinuses/Orbits: Negative. Other: None. MRA HEAD FINDINGS Anterior circulation: No large vessel occlusion, aneurysm, or significant stenosis is identified. Posterior circulation: No large vessel occlusion, aneurysm, or significant stenosis is identified. Anatomic variant: Patent anterior communicating artery. Fetal left PCA. No right posterior communicating artery identified, likely hypoplastic or absent. IMPRESSION: 1. No acute intracranial abnormality identified. 2. Unremarkable MRI of the brain. 3. Patent circle of Willis. No large vessel occlusion, aneurysm, or significant stenosis is identified. Electronically Signed   By: Mitzi Hansen M.D.   On: 02/28/2017 17:52   Ct Head Code Stroke Wo Contrast  Result Date: 02/28/2017 CLINICAL DATA:  Code stroke. New onset of left arm numbness and left lower extremity weakness. Lightheadedness and nausea. Symptoms began 2.5 hours ago. EXAM: CT HEAD WITHOUT CONTRAST TECHNIQUE: Contiguous axial images were obtained from the base of the skull through the vertex without intravenous contrast. COMPARISON:  None. FINDINGS: Brain: No acute infarct, hemorrhage, or mass lesion is present. The ventricles are of normal size. No significant extraaxial fluid collection is present. No significant white matter disease is present. The brainstem and cerebellum are normal. Vascular: No hyperdense vessel or unexpected calcification. Skull: No focal lytic or blastic lesions are present. The calvarium is intact. Sinuses/Orbits: The paranasal sinuses and mastoid air cells are clear. The globes and orbits are within normal limits. ASPECTS [facility] Stroke Program Early CT Score) - Ganglionic level infarction (caudate, lentiform nuclei, internal capsule, insula, M1-M3  cortex): 7/7 - Supraganglionic infarction (M4-M6 cortex): 3/3 Total score (0-10 with 10 being normal): 10/10 IMPRESSION: 1. Negative CT of the head 2. ASPECTS is 10/10 These results were called by telephone at the time of interpretation on 02/28/2017 at 1:24 pm to Dr. Bethann Berkshire , who verbally acknowledged these results. Electronically Signed   By: Marin Roberts M.D.   On: 02/28/2017 13:25     Microbiology: No results found for this or any previous visit (from the past 240 hour(s)).   Labs: Basic Metabolic Panel:  Recent Labs Lab 02/28/17 1304 02/28/17 1312  NA 137 139  K 4.0 3.9  CL 100* 103  CO2 26  --   GLUCOSE 101* 99  BUN 10 9  CREATININE 0.74 0.70  CALCIUM 9.6  --    Liver Function Tests:  Recent Labs Lab 02/28/17 1304  AST 23  ALT 26  ALKPHOS 58  BILITOT 0.5  PROT 7.4  ALBUMIN 4.6   No results for input(s): LIPASE, AMYLASE in the last 168 hours. No results for input(s): AMMONIA in the last 168 hours. CBC:  Recent Labs Lab 02/28/17 1304 02/28/17 1312  WBC 6.4  --   NEUTROABS 2.7  --   HGB 15.8* 15.6*  HCT 45.7 46.0  MCV 92.0  --   PLT 237  --    Cardiac Enzymes: No results for input(s): CKTOTAL,  CKMB, CKMBINDEX, TROPONINI in the last 168 hours. BNP: Invalid input(s): POCBNP CBG: No results for input(s): GLUCAP in the last 168 hours.  Time coordinating discharge:  Greater than 30 minutes  Signed:  Jozette Castrellon, DO [facility] Pager: [phone] 03/01/2017, 2:45 PM

## 2017-03-01 NOTE — Progress Notes (Signed)
OT Cancellation Note  Patient Details Name: Alexis [name] MRN: [id number] DOB: 1966/03/07   Cancelled Treatment:    Reason Eval/Treat Not Completed: OT screened, no needs identified, will sign off. Chart reviewed, pt screened for OT needs. Pt demonstrates independence in ADL completion, LUE strength is 5/5. Pt reports she is feeling much better this am, symptoms have mostly resolved. No further OT needs at this time.    Ezra SitesLeslie Bear Osten, OTR/L  [phone] 03/01/2017, 8:31 AM

## 2017-03-01 NOTE — Evaluation (Signed)
Physical Therapy Evaluation Patient Details Name: Alexis [name] MRN: [id number] DOB: Sep 27, 1965 Today's Date: 03/01/2017   History of Present Illness  Alexis [name] is a 51 y.o. female with no documented past medical history presented with left arm heaviness and numbness from her shoulder to her fingers that started around 10:30 AM on February 28, 2017.  The patient was at work sitting at her desk at the time.  This was associated with an occipital headache.  The patient tried to take some Excedrin without relief.  The patient had some dizziness, but denied any fevers, chills, chest pain, shortness breath, nausea, vomiting.  The patient also complained of some blurry vision, but she did denied dysphasia, dysarthria.  The patient also endorsed "wobbly legs".  She stated that it felt like her left leg was a little weak.  The patient actually drove home and told her significant other about her symptoms.  Subsequently, she was driven by private vehicle to the emergency department around 1 PM.  In the emergency department, the patient stated that her left arm heaviness was 50% better.  She still had difficulty ambulating secondary to her left leg feeling wobbly and weak. Teleneurology recommended admission for stroke work up, but did not feel patient was a tPA candidate.    Clinical Impression  Patient is functioning at baseline and discharged from physical therapy to care of nursing for ambulation ad lib for length of stay in hospital.    Follow Up Recommendations No PT follow up    Equipment Recommendations  None recommended by PT    Recommendations for Other Services       Precautions / Restrictions Precautions Precautions: None Restrictions Weight Bearing Restrictions: No      Mobility  Bed Mobility Overal bed mobility: Independent                Transfers Overall transfer level: Independent Equipment used: None                Ambulation/Gait Ambulation/Gait  assistance: Independent Ambulation Distance (Feet): 150 Feet Assistive device: None Gait Pattern/deviations: WFL(Within Functional Limits)   Gait velocity interpretation: at or above normal speed for age/gender    Stairs Stairs: Yes Stairs assistance: Modified independent (Device/Increase time)   Number of Stairs: 9 General stair comments: good return demonstrated for going up/down steps without loss of balance  Wheelchair Mobility    Modified Rankin (Stroke Patients Only)       Balance Overall balance assessment: Independent                                           Pertinent Vitals/Pain Pain Assessment: 0-10 Pain Score: 7  Pain Location: headache Pain Intervention(s): Monitored during session;Patient requesting pain meds-RN notified    Home Living Family/patient expects to be discharged to:: Private residence Living Arrangements: Children;Non-relatives/Friends (lives with boy friend and 2 daughters) Available Help at Discharge: Family Type of Home: House Home Access: Stairs to enter Entrance Stairs-Rails: None Secretary/administrator of Steps: 1 Home Layout: One level Home Equipment: None      Prior Function Level of Independence: Independent               Hand Dominance        Extremity/Trunk Assessment   Upper Extremity Assessment Upper Extremity Assessment: Defer to OT evaluation    Lower Extremity Assessment Lower Extremity Assessment: Overall [facility]  for tasks assessed    Cervical / Trunk Assessment Cervical / Trunk Assessment: Normal  Communication   Communication: No difficulties  Cognition Arousal/Alertness: Awake/alert Behavior During Therapy: WFL for tasks assessed/performed Overall Cognitive Status: Within Functional Limits for tasks assessed                                        General Comments      Exercises     Assessment/Plan    PT Assessment Patent does not need any further PT  services  PT Problem List         PT Treatment Interventions      PT Goals (Current goals can be found in the Care Plan section)  Acute Rehab PT Goals Patient Stated Goal: return home Time For Goal Achievement: 03/01/17 Potential to Achieve Goals: Good    Frequency     Barriers to discharge        Co-evaluation               AM-PAC PT "6 Clicks" Daily Activity  Outcome Measure Difficulty turning over in bed (including adjusting bedclothes, sheets and blankets)?: None Difficulty moving from lying on back to sitting on the side of the bed? : None Difficulty sitting down on and standing up from a chair with arms (e.g., wheelchair, bedside commode, etc,.)?: None Help needed moving to and from a bed to chair (including a wheelchair)?: None Help needed walking in hospital room?: None Help needed climbing 3-5 steps with a railing? : None 6 Click Score: 24    End of Session   Activity Tolerance: Patient tolerated treatment well Patient left: in bed;with bed alarm set Nurse Communication: Mobility status PT Visit Diagnosis: Unsteadiness on feet (R26.81);Other abnormalities of gait and mobility (R26.89);Muscle weakness (generalized) (M62.81)    Time: [id number] PT Time Calculation (min) (ACUTE ONLY): 23 min   Charges:   PT Evaluation $PT Eval Low Complexity: 1 Low PT Treatments $Gait Training: 8-22 mins   PT G Codes:   PT G-Codes **NOT FOR INPATIENT CLASS** Functional Assessment Tool Used: AM-PAC 6 Clicks Basic Mobility Functional Limitation: Mobility: Walking and moving around Mobility: Walking and Moving Around Current Status (Y7829(G8978): 0 percent impaired, limited or restricted Mobility: Walking and Moving Around Goal Status (F6213(G8979): 0 percent impaired, limited or restricted Mobility: Walking and Moving Around Discharge Status [phone](G8980): 0 percent impaired, limited or restricted    10:42 AM, 03/01/17 Ocie BobJames Tatisha Cerino, MPT Physical Therapist with [facility] 336 [phone] office [phone] mobile phone

## 2017-03-01 NOTE — Progress Notes (Signed)
SLP Cancellation Note  Patient Details Name: Alexis [name] MRN: [id number] DOB: [birth year]   Cancelled treatment:       Reason Eval/Treat Not Completed: SLP screened, no needs identified, will sign off; No changes reported in swallowing, speech, language, or cognition. MRI negative for acute changes. SLE will be deferred at this time. Reconsult if indicated. SLP will sign off.   Thank you,  Havery MorosDabney Mykhia Danish, CCC-SLP [phone]  Adams Hinch 03/01/2017, 3:51 PM

## 2017-03-02 LAB — URINE CULTURE: Culture: NO GROWTH

## 2017-03-02 LAB — HIV ANTIBODY (ROUTINE TESTING W REFLEX): HIV SCREEN 4TH GENERATION: NONREACTIVE

## 2017-03-03 ENCOUNTER — Other Ambulatory Visit: Payer: Self-pay | Admitting: Women's Health

## 2017-03-07 ENCOUNTER — Other Ambulatory Visit: Payer: Self-pay | Admitting: Women's Health

## 2017-03-16 ENCOUNTER — Other Ambulatory Visit (HOSPITAL_COMMUNITY)
Admission: RE | Admit: 2017-03-16 | Discharge: 2017-03-16 | Disposition: A | Discharge status: Home / Self Care | Payer: BLUE CROSS/BLUE SHIELD | Source: Ambulatory Visit | Attending: Obstetrics & Gynecology | Admitting: Obstetrics & Gynecology

## 2017-03-16 ENCOUNTER — Encounter: Payer: Self-pay | Admitting: Women's Health

## 2017-03-16 ENCOUNTER — Ambulatory Visit (INDEPENDENT_AMBULATORY_CARE_PROVIDER_SITE_OTHER): Payer: BLUE CROSS/BLUE SHIELD | Admitting: Women's Health

## 2017-03-16 VITALS — BP 138/80 | HR 80 | Ht 64.0 in | Wt 163.0 lb

## 2017-03-16 DIAGNOSIS — Z01419 Encounter for gynecological examination (general) (routine) without abnormal findings: Secondary | ICD-10-CM | POA: Insufficient documentation

## 2017-03-16 DIAGNOSIS — I10 Essential (primary) hypertension: Secondary | ICD-10-CM | POA: Diagnosis not present

## 2017-03-16 DIAGNOSIS — E782 Mixed hyperlipidemia: Secondary | ICD-10-CM

## 2017-03-16 MED ORDER — OMEPRAZOLE 20 MG PO CPDR: 20.0000 mg | DELAYED_RELEASE_CAPSULE | Freq: Every day | ORAL | 3 refills | Status: DC

## 2017-03-16 NOTE — Patient Instructions (Signed)
Primary Care Providers  Dr. Dwana MelenaZack Hall Vader([place]) [phone]  [facility] [phone]  [facility]([place]) Dr. Karilyn CotaGosrani [phone]  [facility]([place]) [phone]  [facility]([place]) [phone]  [facility] [phone]  Call to schedule your mammogram at [facility] [phone]  You will need a colonoscopy when you turn 51yo [facility]astro[facility]  [facility] (Dr. Jena Gaussourk & Dr. Darrick PennaFields) [phone]  Dr. Karilyn Cotaehman [phone]

## 2017-03-16 NOTE — Progress Notes (Signed)
WELL-WOMAN EXAMINATION Patient name: Alexis [name] MRN [id number]  Date of birth: 14-Dec-1965 Chief Complaint:   Gynecologic Exam  History of Present Illness:   Alexis [name] is a 51 y.o. 542P2002 Caucasian female being seen today for a routine well-woman exam, new to our practice, [place]. Has not gotten established w/ PCP. [facility] w/ complicated migraine/Lt hemiparesis-had neg CT/MRI/MRA brain, no hemodynamically significant stenosis on carotid duplex, normal echo- started on baby ASA, new dx essential hypertension-started on norvasc 10mg , new dx hyperlipidemia- started on  Fenofibrate 160mg  and lipitor 20mg . Requests rx for omeprazole- has been buying OTC.  Has Mirena that was placed 3880yrs ago, needs removed, no strings. Insurance changes 12/1 w/ high deductible, however [place] and doesn't want removed prior to trip b/c doesn't want to be on period.  Current complaints: none  PCP: none      does not desire labs, all labs done on recent hospital admission No LMP recorded. Patient is not currently having periods (Reason: IUD). The current method of family planning is IUD Last pap 6948yrs ago. Results were: normal Last mammogram: >6646yr ago. Results were: normal Last colonoscopy: 7348yrs ago d/t colitis from antibiotics- thinks they removed a polyp, unsure when she needs next one. Results were: colitis/possible polyp  Review of Systems:   Pertinent items are noted in HPI Denies any headaches, blurred vision, fatigue, shortness of breath, chest pain, abdominal pain, abnormal vaginal discharge/itching/odor/irritation, problems with periods, bowel movements, urination, or intercourse unless otherwise stated above. Pertinent History Reviewed:  Reviewed past medical,surgical, social and family history.  Reviewed problem list, medications and allergies. Physical Assessment:   Vitals:   03/16/17 1536  BP: 138/80  Pulse: 80    Weight: 163 lb (73.9 kg)  Height: 5\' 4"  (1.626 m)  Body mass index is 27.98 kg/m.        Physical Examination:   General appearance - well appearing, and in no distress  Mental status - alert, oriented to person, place, and time  Psych:  She has a normal mood and affect  Skin - warm and dry, normal color, no suspicious lesions noted  Chest - effort normal, all lung fields clear to auscultation bilaterally  Heart - normal rate and regular rhythm  Neck:  midline trachea, no thyromegaly or nodules  Breasts - breasts appear normal, no suspicious masses, no skin or nipple changes or  axillary nodes  Abdomen - soft, nontender, nondistended, no masses or organomegaly  Pelvic - VULVA: normal appearing vulva with no masses, tenderness or lesions  VAGINA: normal appearing vagina with normal color and discharge, no lesions  CERVIX: normal appearing cervix without discharge or lesions, no CMT-NO IUD STRINGS visible  Thin prep pap is done w/ HR HPV cotesting  UTERUS: uterus is felt to be normal size, shape, consistency and nontender   ADNEXA: No adnexal masses or tenderness noted.  Extremities:  No swelling or varicosities noted  No results found for this or any previous visit (from the past 24 hour(s)).  Assessment & Plan:  1) Well-Woman Exam  2) CHTN- recent dx, continue norvasc 10mg  daily  3) Hypercholesterolemia and hypertriglyceridemia> continue fenofibrate 160mg   and lipitor 20mg    Labs/procedures today: pap  Mammogram -gave # to [facility]- to call to schedule screening mammo Colonoscopy - gave # to local GI MDs to establish care/discuss previous TCS/get records- see when next TCS needed  Gave list of local PCPs- to  review and call whichever she decides on asap to establish care  No orders of the defined types were placed in this encounter.  Follow-up: Return for will call for IUD removal .after she checks w/ insurance/decides when she wants removed  Marge DuncansBooker, Demetrice Combes Randall CNM,  [facility] 03/16/2017 3:53pm

## 2017-03-18 LAB — CYTOLOGY - PAP
Diagnosis: NEGATIVE
HPV (WINDOPATH): NOT DETECTED

## 2017-03-23 ENCOUNTER — Other Ambulatory Visit: Payer: Self-pay

## 2017-03-23 ENCOUNTER — Ambulatory Visit: Payer: BLUE CROSS/BLUE SHIELD | Admitting: Family Medicine

## 2017-03-23 ENCOUNTER — Encounter: Payer: Self-pay | Admitting: Family Medicine

## 2017-03-23 VITALS — BP 128/88 | HR 92 | Temp 98.9°F | Resp 18 | Ht 64.0 in | Wt 162.0 lb

## 2017-03-23 DIAGNOSIS — K219 Gastro-esophageal reflux disease without esophagitis: Secondary | ICD-10-CM | POA: Insufficient documentation

## 2017-03-23 DIAGNOSIS — Z09 Encounter for follow-up examination after completed treatment for conditions other than malignant neoplasm: Secondary | ICD-10-CM

## 2017-03-23 DIAGNOSIS — M47816 Spondylosis without myelopathy or radiculopathy, lumbar region: Secondary | ICD-10-CM | POA: Insufficient documentation

## 2017-03-23 DIAGNOSIS — Z23 Encounter for immunization: Secondary | ICD-10-CM

## 2017-03-23 DIAGNOSIS — I10 Essential (primary) hypertension: Secondary | ICD-10-CM

## 2017-03-23 DIAGNOSIS — E782 Mixed hyperlipidemia: Secondary | ICD-10-CM

## 2017-03-23 HISTORY — DX: Spondylosis without myelopathy or radiculopathy, lumbar region: M47.816

## 2017-03-23 MED ORDER — ATORVASTATIN CALCIUM 20 MG PO TABS: 20.0000 mg | ORAL_TABLET | Freq: Every day | ORAL | 3 refills | Status: DC

## 2017-03-23 MED ORDER — FENOFIBRATE 160 MG PO TABS: 160.0000 mg | ORAL_TABLET | Freq: Every day | ORAL | 3 refills | Status: DC

## 2017-03-23 MED ORDER — ATORVASTATIN CALCIUM 20 MG PO TABS
20.0000 mg | ORAL_TABLET | Freq: Every day | ORAL | 3 refills | Status: DC
Start: 2017-03-23 — End: 2017-03-23

## 2017-03-23 MED ORDER — LISINOPRIL-HYDROCHLOROTHIAZIDE 10-12.5 MG PO TABS: 1.0000 | ORAL_TABLET | Freq: Every day | ORAL | 3 refills | Status: DC

## 2017-03-23 MED ORDER — CYCLOBENZAPRINE HCL 5 MG PO TABS: 5.0000 mg | ORAL_TABLET | Freq: Three times a day (TID) | ORAL | 1 refills | Status: DC | PRN

## 2017-03-23 NOTE — Progress Notes (Signed)
Chief Complaint  Patient presents with  . Hypertension   This is a new patient to establish.  She had a recent hospitalization for arm numbness, initially thought to be a stroke.  She was diagnosed eventually as having an atypical migraine.  She recovered completely with no numbness or weakness. She was found during her hospitalization to have hypertension.  She was treated with amlodipine.  This is working well for her, however, it is causing uncomfortable pedal edema.  Going to discontinue the amlodipine and try her on lisinopril/hydrochlorothiazide for her blood pressure in its place. She was also found to be hyperlipidemic.  She was started on Lipitor and fenofibrate.  She does have a family history of heart disease at a young age, her younger sister has already had a heart attack and 2 strokes.  She does states that her younger sister has an unhealthy lifestyle with smoking and alcohol drinking. Her chart indicates that she has been alcohol abuse problem.  She tells me she drinks 1 beer a day with dinner, and then has a cocktail at bedtime to help her sleep.  She does drink pretty much every day, but never to intoxication.  She is never had a history of alcohol abuse.  She does not feel her drinking is excessive. She has a history of chronic low back pain, intermittent.  Her prior doctor told her she had degenerative degenerative disc disease.  She treats this with over-the-counter ibuprofen and as needed Flexeril.  Flexeril is refilled for as needed use. Pap smears up-to-date.  Colonoscopy was done in 2016 for diarrhea and colitis.  Due again in 10 years. She agrees to a flu shot today.  Unknown tetanus status.  We will request her old records. We discussed her hospitalization, test results, imaging, and echocardiogram.  Everything was normal or as expected with the exception of the hypertension hyperlipidemia that was managed.  Patient Active Problem List   Diagnosis Date Noted  . GERD  (gastroesophageal reflux disease) 03/23/2017  . Degenerative joint disease (DJD) of lumbar spine 03/23/2017  . Mixed hyperlipidemia 03/01/2017  . Complicated migraine 03/01/2017  . Left hemiparesis (HCC) 02/28/2017  . Essential hypertension 02/28/2017    Outpatient Encounter Medications as of 03/23/2017  Medication Sig  . aspirin EC 81 MG tablet Take 1 tablet (81 mg total) by mouth daily.  Marland Kitchen. atorvastatin (LIPITOR) 20 MG tablet Take 1 tablet (20 mg total) by mouth daily at 6 PM.  . fenofibrate 160 MG tablet Take 1 tablet (160 mg total) by mouth daily.  . fexofenadine (ALLEGRA) 180 MG tablet Take 180 mg by mouth daily as needed for allergies.  . Lactobacillus (PROBIOTIC ACIDOPHILUS PO) Take 1 capsule by mouth daily.  . Melatonin 3 MG TABS Take 3 mg by mouth at bedtime.  Marland Kitchen. omeprazole (PRILOSEC) 20 MG capsule Take 1 capsule (20 mg total) daily by mouth.  . cyclobenzaprine (FLEXERIL) 5 MG tablet Take 1 tablet (5 mg total) by mouth 3 (three) times daily as needed for muscle spasms.  Marland Kitchen. lisinopril-hydrochlorothiazide (PRINZIDE,ZESTORETIC) 10-12.5 MG tablet Take 1 tablet by mouth daily.   No facility-administered encounter medications on file as of 03/23/2017.     Past Medical History:  Diagnosis Date  . Allergy   . Degenerative joint disease (DJD) of lumbar spine 03/23/2017  . GERD (gastroesophageal reflux disease)   . Hyperlipidemia   . Hypertension   . Migraines   . Sleep apnea     History reviewed. No pertinent surgical history.  Social History   Socioeconomic History  . Marital status: Media planner    Spouse name: Dorene Sorrow  . Number of children: 2  . Years of education: 65  . Highest education level: Associate degree: academic program  Social Needs  . Financial resource strain: Not hard at all  . Food insecurity - worry: Never true  . Food insecurity - inability: Never true  . Transportation needs - medical: No  . Transportation needs - non-medical: No  Occupational  History  . Occupation: payroll/accounting  Tobacco Use  . Smoking status: Never Smoker  . Smokeless tobacco: Never Used  Substance and Sexual Activity  . Alcohol use: Yes    Alcohol/week: 1.2 oz    Types: 1 Cans of beer, 1 Shots of liquor per week    Comment: daily   . Drug use: No  . Sexual activity: Yes    Birth control/protection: IUD  Other Topics Concern  . Not on file  Social History Narrative   Divorced, lives with Dorene Sorrow   Has two daughters 10 and 82   Likes outdoors/acreage    Family History  Problem Relation Age of Onset  . Heart disease Maternal Grandmother   . Heart disease Maternal Grandfather   . Cancer Father        prostate  . Hypertension Father   . Arthritis Father   . Asthma Father   . Hyperlipidemia Father   . Hypertension Mother   . Hyperlipidemia Mother   . Miscarriages / [place] Mother   . Stroke Sister   . Heart disease Sister   . Alcohol abuse Sister   . Thyroid nodules Daughter     Review of Systems  Constitutional: Negative for chills, fever and weight loss.  HENT: Negative for congestion and hearing loss.   Eyes: Negative for blurred vision and pain.  Respiratory: Negative for cough and shortness of breath.   Cardiovascular: Negative for chest pain and leg swelling.  Gastrointestinal: Negative for abdominal pain, constipation, diarrhea and heartburn.  Genitourinary: Negative for dysuria and frequency.  Musculoskeletal: Positive for back pain. Negative for falls, joint pain and myalgias.       Occasional  Neurological: Negative for dizziness, seizures and headaches.  Psychiatric/Behavioral: Negative for depression. The patient is not nervous/anxious and does not have insomnia.     BP 128/88 (BP Location: Right Arm, Patient Position: Sitting, Cuff Size: Normal)   Pulse 92   Temp 98.9 F (37.2 C) (Temporal)   Resp 18   Ht 5\' 4"  (1.626 m)   Wt 162 lb 0.6 oz (73.5 kg)   SpO2 98%   BMI 27.81 kg/m   Physical Exam    Constitutional: She is oriented to person, place, and time. She appears well-developed and well-nourished.  HENT:  Head: Normocephalic and atraumatic.  Mouth/Throat: Oropharynx is clear and moist.  Eyes: Conjunctivae are normal. Pupils are equal, round, and reactive to light.  Glasses  Neck: Normal range of motion. Neck supple. No thyromegaly present.  Cardiovascular: Normal rate, regular rhythm and normal heart sounds.  Pulmonary/Chest: Effort normal and breath sounds normal. No respiratory distress.  Abdominal: Soft. Bowel sounds are normal.  Musculoskeletal: Normal range of motion. She exhibits no edema.  Lymphadenopathy:    She has no cervical adenopathy.  Neurological: She is alert and oriented to person, place, and time.  Gait normal  Skin: Skin is warm and dry.  Psychiatric: She has a normal mood and affect. Her behavior is normal. Thought content normal.  Nursing note and vitals reviewed. ASSESSMENT/PLAN:   1. Gastroesophageal reflux disease, esophagitis presence not specified Controlled on omeprazole  2. Essential hypertension Trolled on amlodipine edema.  I am changing her to lisinopril and will follow  3. Mixed hyperlipidemia Recently started on Lipitor and fenofibrate.  Will check lipid panel in 3 months - Lipid panel - BASIC METABOLIC PANEL WITH GFR - Hepatic function panel  4. Osteoarthritis of lumbar spine, unspecified spinal osteoarthritis complication status History.  Flexeril refilled  Hospital discharge follow-up  Patient Instructions  Stop the amlodipine Start the lisinopril/HCTZ Keep track of your BP Continue same medicines Need to check the cholesterol again in 3 months See me after labs Stay active  Call for problems     Eustace MooreYvonne Sue Doranne Schmutz, MD

## 2017-03-23 NOTE — Addendum Note (Signed)
Addended by: Crawford GivensPUTMAN, Neils [place] M on: 03/23/2017 11:26 AM   Modules accepted: Orders

## 2017-03-23 NOTE — Patient Instructions (Signed)
Stop the amlodipine Start the lisinopril/HCTZ Keep track of your BP Continue same medicines Need to check the cholesterol again in 3 months See me after labs Stay active  Call for problems

## 2017-03-30 ENCOUNTER — Ambulatory Visit: Payer: BLUE CROSS/BLUE SHIELD | Admitting: Women's Health

## 2017-04-22 ENCOUNTER — Ambulatory Visit (INDEPENDENT_AMBULATORY_CARE_PROVIDER_SITE_OTHER): Payer: 59 | Admitting: Women's Health

## 2017-04-22 ENCOUNTER — Encounter: Payer: Self-pay | Admitting: Women's Health

## 2017-04-22 VITALS — BP 102/80 | HR 94 | Ht 65.0 in | Wt 162.4 lb

## 2017-04-22 DIAGNOSIS — Z30432 Encounter for removal of intrauterine contraceptive device: Secondary | ICD-10-CM | POA: Diagnosis not present

## 2017-04-22 NOTE — Progress Notes (Signed)
   IUD REMOVAL  Patient name: Alexis [name] Aderman MRN [id number]  Date of birth: February 08, 1966 Subjective Findings:   Alexis [name] Rubenstein is a 51 y.o. 492P2002 Caucasian female being seen today for removal of a Mirena IUD. Her IUD was placed 5891yrs ago at different location.  She desires removal because it is time to come out.  Signed copy of informed consent in chart.   No LMP recorded. Patient is not currently having periods (Reason: IUD). Last pap11/14/18. Results were:  normal The planned method of family planning is partner has had vasectomy Pertinent History Reviewed:   Reviewed past medical,surgical, social, obstetrical and family history.  Reviewed problem list, medications and allergies. Objective Findings & Procedure:    Vitals:   04/22/17 1246  BP: 102/80  Pulse: 94  Weight: 162 lb 6.4 oz (73.7 kg)  Height: 5\' 5"  (1.651 m)  Body mass index is 27.02 kg/m.  No results found for this or any previous visit (from the past 24 hour(s)).   Time out was performed.  A graves speculum was placed in the vagina.  The cervix was visualized, and the strings WERE NOT visible. They were teased out w/ an IUD hook, and then they were grasped and the Mirena IUD was easily removed intact without complications. The patient tolerated the procedure well.  Assessment & Plan:   1) Mirena IUD removal Follow-up prn problems  No orders of the defined types were placed in this encounter.   Follow-up: Return for after 11/14 for physical.  Marge DuncansBooker, Tajai Ihde Randall CNM, [facility] 04/22/2017 1:14 PM

## 2017-06-10 ENCOUNTER — Ambulatory Visit: Payer: BLUE CROSS/BLUE SHIELD | Admitting: Neurology

## 2017-06-11 DIAGNOSIS — E782 Mixed hyperlipidemia: Secondary | ICD-10-CM | POA: Diagnosis not present

## 2017-06-11 LAB — BASIC METABOLIC PANEL WITH GFR
BUN: 13 mg/dL (ref 7–25)
CO2: 29 mmol/L (ref 20–32)
Calcium: 9.9 mg/dL (ref 8.6–10.4)
Chloride: 101 mmol/L (ref 98–110)
Creat: 0.77 mg/dL (ref 0.50–1.05)
GFR, Est African American: 104 mL/min/{1.73_m2} (ref >=60)
GFR, Est Non African American: 90 mL/min/{1.73_m2} (ref >=60)
GLUCOSE: 109 mg/dL — AB (ref 65–99)
POTASSIUM: 4 mmol/L (ref 3.5–5.3)
SODIUM: 138 mmol/L (ref 135–146)

## 2017-06-11 LAB — HEPATIC FUNCTION PANEL
AG Ratio: 2.3 (calc) (ref 1.0–2.5)
ALKALINE PHOSPHATASE (APISO): 48 U/L (ref 33–130)
ALT: 28 U/L (ref 6–29)
AST: 23 U/L (ref 10–35)
Albumin: 4.5 g/dL (ref 3.6–5.1)
BILIRUBIN INDIRECT: 0.4 mg/dL (ref 0.2–1.2)
Bilirubin, Direct: 0.1 mg/dL (ref 0.0–0.2)
Globulin: 2 g/dL (calc) (ref 1.9–3.7)
TOTAL PROTEIN: 6.5 g/dL (ref 6.1–8.1)
Total Bilirubin: 0.5 mg/dL (ref 0.2–1.2)

## 2017-06-11 LAB — LIPID PANEL
CHOL/HDL RATIO: 3.7 (calc) (ref <5.0)
Cholesterol: 209 mg/dL — ABNORMAL HIGH (ref <200)
HDL: 57 mg/dL (ref >50)
LDL CHOLESTEROL (CALC): 124 mg/dL — AB
Non-HDL Cholesterol (Calc): 152 mg/dL (calc) — ABNORMAL HIGH (ref <130)
Triglycerides: 162 mg/dL — ABNORMAL HIGH (ref <150)

## 2017-06-15 ENCOUNTER — Encounter: Payer: Self-pay | Admitting: Family Medicine

## 2017-06-23 ENCOUNTER — Ambulatory Visit (INDEPENDENT_AMBULATORY_CARE_PROVIDER_SITE_OTHER): Payer: 59 | Admitting: Family Medicine

## 2017-06-23 ENCOUNTER — Other Ambulatory Visit: Payer: Self-pay

## 2017-06-23 ENCOUNTER — Encounter: Payer: Self-pay | Admitting: Family Medicine

## 2017-06-23 VITALS — BP 128/86 | HR 92 | Temp 98.9°F | Resp 18 | Ht 65.0 in | Wt 163.1 lb

## 2017-06-23 DIAGNOSIS — I10 Essential (primary) hypertension: Secondary | ICD-10-CM | POA: Diagnosis not present

## 2017-06-23 DIAGNOSIS — Z1231 Encounter for screening mammogram for malignant neoplasm of breast: Secondary | ICD-10-CM

## 2017-06-23 DIAGNOSIS — E782 Mixed hyperlipidemia: Secondary | ICD-10-CM

## 2017-06-23 DIAGNOSIS — Z1239 Encounter for other screening for malignant neoplasm of breast: Secondary | ICD-10-CM

## 2017-06-23 NOTE — Patient Instructions (Signed)
You are doing well Blood pressure is great Cholesterol is much better Need follow up in six months You will see Dr Tracie HarrierHagler

## 2017-06-23 NOTE — Progress Notes (Signed)
Chief Complaint  Patient presents with  . Follow-up    lab rev   Patient is here for routine follow-up. She is feeling well and compliant with her medications. Her weight is stable.  She complains she is not getting enough exercise.  I agree.  We discussed ways to increase her activity.  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 She was started on a statin at her last visit for hyperlipidemia.  Her cholesterol is improved.  She is tolerating the statin well.  Blood pressure control is excellent.  Results for Elmer SowWESCOTT, Alexis (MRN [id number]) as of 06/23/2017 09:45  Ref. Range 03/01/2017 06:15 06/11/2017 08:22  Total CHOL/HDL Ratio Latest Ref Range: <5.0 (calc) 5.5 3.7  Cholesterol Latest Ref Range: <200 mg/dL 409253 (H) 811209 (H)  HDL Cholesterol Latest Ref Range: >50 mg/dL 46 57  LDL Cholesterol (Calc) Latest Units: mg/dL (calc)  914124 (H)  Non-HDL Cholesterol (Calc) Latest Ref Range: <130 mg/dL (calc)  782152 (H)  Triglycerides Latest Ref Range: <150 mg/dL 956427 (H) 213162 (H)     Patient Active Problem List   Diagnosis Date Noted  . Encounter for IUD removal 04/22/2017  . GERD (gastroesophageal reflux disease) 03/23/2017  . Degenerative joint disease (DJD) of lumbar spine 03/23/2017  . Mixed hyperlipidemia 03/01/2017  . Complicated migraine 03/01/2017  . Left hemiparesis (HCC) 02/28/2017  . Essential hypertension 02/28/2017    Outpatient Encounter Medications as of 06/23/2017  Medication Sig  . aspirin EC 81 MG tablet Take 1 tablet (81 mg total) by mouth daily.  Marland Kitchen. atorvastatin (LIPITOR) 20 MG tablet Take 1 tablet (20 mg total) by mouth daily at 6 PM.  . cyclobenzaprine (FLEXERIL) 5 MG tablet Take 1 tablet (5 mg total) by mouth 3 (three) times daily as needed for muscle spasms.  . fenofibrate 160 MG tablet Take 1 tablet (160 mg total) by mouth daily.  . fexofenadine (ALLEGRA) 180 MG tablet Take 180 mg by mouth daily as needed for allergies.  . Lactobacillus  (PROBIOTIC ACIDOPHILUS PO) Take 1 capsule by mouth daily.  Marland Kitchen. lisinopril-hydrochlorothiazide (PRINZIDE,ZESTORETIC) 10-12.5 MG tablet Take 1 tablet by mouth daily.  . Melatonin 3 MG TABS Take 3 mg by mouth at bedtime.  Marland Kitchen. omeprazole (PRILOSEC) 20 MG capsule Take 1 capsule (20 mg total) daily by mouth.   No facility-administered encounter medications on file as of 06/23/2017.     Allergies  Allergen Reactions  . Azithromycin Hives    After a few days  . Codeine Hives  . Penicillins Hives    Has patient had a PCN reaction causing immediate rash, facial/tongue/throat swelling, SOB or lightheadedness with hypotension: NO Has patient had a PCN reaction causing severe rash involving mucus membranes or skin necrosis: No Has patient had a PCN reaction that required hospitalization: No Has patient had a PCN reaction occurring within the last 10 years: No If all of the above answers are "NO", then may proceed with Cephalosporin use.   . Amlodipine Swelling    Pedal edema  . Tetracyclines & Related Nausea And Vomiting    GI distress  . Tramadol Itching    Side effect    Review of Systems  Constitutional: Negative for activity change, appetite change and unexpected weight change.  HENT: Negative for congestion, dental problem, postnasal drip and rhinorrhea.   Eyes: Negative for redness and visual disturbance.  Respiratory: Negative for cough and shortness of breath.   Cardiovascular: Negative for chest pain,  palpitations and leg swelling.  Gastrointestinal: Negative for abdominal pain, constipation and diarrhea.  Genitourinary: Negative for difficulty urinating and frequency.  Musculoskeletal: Negative for arthralgias and back pain.  Neurological: Negative for dizziness and headaches.  Psychiatric/Behavioral: Negative for dysphoric mood and sleep disturbance. The patient is not nervous/anxious.     BP 128/86 (BP Location: Left Arm, Patient Position: Sitting, Cuff Size: Normal)   Pulse 92    Temp 98.9 F (37.2 C) (Temporal)   Resp 18   Ht 5\' 5"  (1.651 m)   Wt 163 lb 1.3 oz (74 kg)   SpO2 97%   BMI 27.14 kg/m   Physical Exam  Constitutional: She is oriented to person, place, and time. She appears well-developed and well-nourished.  HENT:  Head: Normocephalic and atraumatic.  Mouth/Throat: Oropharynx is clear and moist.  Eyes: Conjunctivae are normal. Pupils are equal, round, and reactive to light.  Neck: Normal range of motion. Neck supple. No thyromegaly present.  Cardiovascular: Normal rate, regular rhythm and normal heart sounds.  Pulmonary/Chest: Effort normal and breath sounds normal. No respiratory distress.  Musculoskeletal: Normal range of motion. She exhibits no edema.  Lymphadenopathy:    She has no cervical adenopathy.  Neurological: She is alert and oriented to person, place, and time.  Gait normal  Skin: Skin is warm and dry.  Psychiatric: She has a normal mood and affect. Her behavior is normal. Thought content normal.  Nursing note and vitals reviewed.   ASSESSMENT/PLAN:  1. Essential hypertension Excellent control  2. Mixed hyperlipidemia Improved, risk of heart disease improved  3. Screening for breast cancer Overdue, ordered - MM Digital Screening; Future   Patient Instructions  You are doing well Blood pressure is great Cholesterol is much better Need follow up in six months You will see Dr Tracie Harrier   Eustace Moore, MD

## 2017-07-11 ENCOUNTER — Encounter: Payer: Self-pay | Admitting: Family Medicine

## 2017-12-23 ENCOUNTER — Ambulatory Visit (INDEPENDENT_AMBULATORY_CARE_PROVIDER_SITE_OTHER): Payer: 59 | Admitting: Family Medicine

## 2017-12-23 ENCOUNTER — Ambulatory Visit: Payer: 59 | Admitting: Family Medicine

## 2017-12-23 ENCOUNTER — Encounter: Payer: Self-pay | Admitting: Family Medicine

## 2017-12-23 VITALS — BP 106/74 | HR 74 | Temp 97.8°F | Ht 65.0 in | Wt 162.0 lb

## 2017-12-23 DIAGNOSIS — K219 Gastro-esophageal reflux disease without esophagitis: Secondary | ICD-10-CM | POA: Diagnosis not present

## 2017-12-23 DIAGNOSIS — I10 Essential (primary) hypertension: Secondary | ICD-10-CM

## 2017-12-23 DIAGNOSIS — E782 Mixed hyperlipidemia: Secondary | ICD-10-CM

## 2017-12-23 DIAGNOSIS — Z7689 Persons encountering health services in other specified circumstances: Secondary | ICD-10-CM | POA: Diagnosis not present

## 2017-12-23 DIAGNOSIS — E663 Overweight: Secondary | ICD-10-CM

## 2017-12-23 NOTE — Progress Notes (Signed)
BP 106/74 (BP Location: Left Arm)   Pulse 74   Temp 97.8 F (36.6 C) (Oral)   Ht '5\' 5"'  (1.651 m)   Wt 162 lb (73.5 kg)   BMI 26.96 kg/m    Subjective:    Patient ID: Alexis [name], female    DOB: Sep 03, 1965, 52 y.o.   MRN: [id number]  HPI: Alexis [name] is a 52 y.o. female presenting on 12/23/2017 for Establish Care; Hyperlipidemia; and Hypertension   HPI Hypertension Patient is coming his new patient to establish care with our office.  Patient is currently on lisinopril-hydrochlorothiazide, and their blood pressure today is 74. Patient denies any lightheadedness or dizziness. Patient denies headaches, blurred vision, chest pains, shortness of breath, or weakness. Denies any side effects from medication and is content with current medication.   Hyperlipidemia Patient is coming in for recheck of his hyperlipidemia. The patient is currently taking Lipitor. They deny any issues with myalgias or history of liver damage from it. They deny any focal numbness or weakness or chest pain.   GERD Patient is currently on omeprazole and probiotic.  She denies any major symptoms or abdominal pain or belching or burping. She denies any blood in her stool or lightheadedness or dizziness.   Relevant past medical, surgical, family and social history reviewed and updated as indicated. Interim medical history since our last visit reviewed. Allergies and medications reviewed and updated.  Review of Systems  Constitutional: Negative for chills and fever.  HENT: Negative for congestion, ear discharge, ear pain and tinnitus.   Eyes: Negative for pain, redness and visual disturbance.  Respiratory: Negative for cough, chest tightness, shortness of breath and wheezing.   Cardiovascular: Negative for chest pain, palpitations and leg swelling.  Gastrointestinal: Negative for abdominal pain, blood in stool, constipation and diarrhea.  Genitourinary: Negative for difficulty urinating, dysuria and  hematuria.  Musculoskeletal: Negative for back pain, gait problem and myalgias.  Skin: Negative for rash.  Neurological: Negative for dizziness, weakness, light-headedness and headaches.  Psychiatric/Behavioral: Negative for agitation, behavioral problems and suicidal ideas.  All other systems reviewed and are negative.   Per HPI unless specifically indicated above  Social History   Socioeconomic History  . Marital status: Significant Other    Spouse name: [name]  . Number of children: 2  . Years of education: 52  . Highest education level: Associate degree: academic program  Occupational History  . Occupation: payroll/accounting  Social Needs  . Financial resource strain: Not hard at all  . Food insecurity:    Worry: Never true    Inability: Never true  . Transportation needs:    Medical: No    Non-medical: No  Tobacco Use  . Smoking status: Former Smoker    Packs/day: 0.50    Types: Cigarettes    Last attempt to quit: 06/03/2016    Years since quitting: 1.5  . Smokeless tobacco: Never Used  Substance and Sexual Activity  . Alcohol use: Yes    Alcohol/week: 2.0 standard drinks    Types: 1 Cans of beer, 1 Shots of liquor per week    Comment: daily   . Drug use: No  . Sexual activity: Yes    Comment: boyfriend of 2016, 2 kids 28 and 19  Lifestyle  . Physical activity:    Days per week: 0 days    Minutes per session: 0 min  . Stress: To some extent  Relationships  . Social connections:    Talks on phone: More than three  times a week    Gets together: More than three times a week    Attends religious service: More than 4 times per year    Active member of club or organization: No    Attends meetings of clubs or organizations: Never    Relationship status: Living with partner  . Intimate partner violence:    Fear of current or ex partner: No    Emotionally abused: No    Physically abused: No    Forced sexual activity: No  Other Topics Concern  . Not on file    Social History Narrative   Divorced, lives with [name]- sig. other   Has two daughters 65 and 64   Likes outdoors/acreage    History reviewed. No pertinent surgical history.  Family History  Problem Relation Age of Onset  . Heart disease Maternal Grandmother   . Heart disease Maternal Grandfather   . Cancer Father        prostate  . Hypertension Father   . Arthritis Father   . Asthma Father   . Hyperlipidemia Father   . Hypertension Mother   . Hyperlipidemia Mother   . Miscarriages / [place] Mother   . Stroke Sister   . Heart disease Sister   . Alcohol abuse Sister   . Thyroid nodules Daughter     Allergies as of 12/23/2017      Reactions   Azithromycin [name]   After a few days   Codeine [name]   Penicillins [name]   Has patient had a PCN reaction causing immediate rash, facial/tongue/throat swelling, SOB or lightheadedness with hypotension: NO Has patient had a PCN reaction causing severe rash involving mucus membranes or skin necrosis: No Has patient had a PCN reaction that required hospitalization: No Has patient had a PCN reaction occurring within the last 10 years: No If all of the above answers are "NO", then may proceed with Cephalosporin use.   Amlodipine Swelling   Pedal edema   Tetracyclines & Related Nausea And Vomiting   GI distress   Tramadol Itching   Side effect      Medication List        Accurate as of 12/23/17 10:20 AM. Always use your most recent med list.          aspirin EC 81 MG tablet Take 1 tablet (81 mg total) by mouth daily.   atorvastatin 20 MG tablet Commonly known as:  LIPITOR Take 1 tablet (20 mg total) by mouth daily at 6 PM.   Black Cohosh 540 MG Caps Take 1 capsule by mouth daily.   cyclobenzaprine 5 MG tablet Commonly known as:  FLEXERIL Take 1 tablet (5 mg total) by mouth 3 (three) times daily as needed for muscle spasms.   fenofibrate 160 MG tablet Take 1 tablet (160 mg total) by mouth daily.   fexofenadine  180 MG tablet Commonly known as:  ALLEGRA Take 180 mg by mouth daily as needed for allergies.   lisinopril-hydrochlorothiazide 10-12.5 MG tablet Commonly known as:  PRINZIDE,ZESTORETIC Take 1 tablet by mouth daily.   Melatonin 3 MG Tabs Take 3 mg by mouth at bedtime.   omeprazole 20 MG capsule Commonly known as:  PRILOSEC Take 1 capsule (20 mg total) daily by mouth.   PROBIOTIC ACIDOPHILUS PO Take 1 capsule by mouth daily.          Objective:    BP 106/74 (BP Location: Left Arm)   Pulse 74   Temp 97.8 F (36.6 C) (  Oral)   Ht '5\' 5"'  (1.651 m)   Wt 162 lb (73.5 kg)   BMI 26.96 kg/m   Wt Readings from Last 3 Encounters:  12/23/17 162 lb (73.5 kg)  06/23/17 163 lb 1.3 oz (74 kg)  04/22/17 162 lb 6.4 oz (73.7 kg)    Physical Exam  Constitutional: She is oriented to person, place, and time. She appears well-developed and well-nourished. No distress.  Eyes: Pupils are equal, round, and reactive to light. Conjunctivae and EOM are normal.  Neck: Neck supple. No thyromegaly present.  Cardiovascular: Normal rate, regular rhythm, normal heart sounds and intact distal pulses.  No murmur heard. Pulmonary/Chest: Effort normal and breath sounds normal. No respiratory distress. She has no wheezes.  Abdominal: Soft. Bowel sounds are normal. She exhibits no distension and no mass. There is no tenderness. There is no guarding.  Musculoskeletal: Normal range of motion. She exhibits no edema or tenderness.  Lymphadenopathy:    She has no cervical adenopathy.  Neurological: She is alert and oriented to person, place, and time. Coordination normal.  Skin: Skin is warm and dry. No rash noted. She is not diaphoretic.  Psychiatric: She has a normal mood and affect. Her behavior is normal.  Nursing note and vitals reviewed.       Assessment & Plan:   Problem List Items Addressed This Visit      Cardiovascular and Mediastinum   Essential hypertension   Relevant Orders   CMP14+EGFR      Digestive   GERD (gastroesophageal reflux disease)   Relevant Orders   CBC with Differential/Platelet     Other   Mixed hyperlipidemia   Relevant Orders   Lipid panel   Overweight (BMI 25.0-29.9)    Other Visit Diagnoses    Establishing care with new doctor, encounter for    -  Primary      Continue medications from previous doctor, will run lab work and see if anything needs to be adjusted.  Follow up plan: Return in about 6 months (around 06/25/2018), or if symptoms worsen or fail to improve, for recheck cholesterol and BP.  Caryl Pina, MD [facility] 12/23/2017, 10:20 AM

## 2017-12-24 LAB — CBC WITH DIFFERENTIAL/PLATELET
BASOS ABS: 0 10*3/uL (ref 0.0–0.2)
Basos: 1 %
EOS (ABSOLUTE): 0.1 10*3/uL (ref 0.0–0.4)
EOS: 3 %
HEMOGLOBIN: 14 g/dL (ref 11.1–15.9)
Hematocrit: 41.7 % (ref 34.0–46.6)
IMMATURE GRANS (ABS): 0 10*3/uL (ref 0.0–0.1)
Immature Granulocytes: 0 %
LYMPHS: 41 %
Lymphocytes Absolute: 2.1 10*3/uL (ref 0.7–3.1)
MCH: 30.3 pg (ref 26.6–33.0)
MCHC: 33.6 g/dL (ref 31.5–35.7)
MCV: 90 fL (ref 79–97)
MONOCYTES: 9 %
Monocytes Absolute: 0.4 10*3/uL (ref 0.1–0.9)
NEUTROS ABS: 2.3 10*3/uL (ref 1.4–7.0)
Neutrophils: 46 %
Platelets: 307 10*3/uL (ref 150–450)
RBC: 4.62 x10E6/uL (ref 3.77–5.28)
RDW: 12.4 % (ref 12.3–15.4)
WBC: 5 10*3/uL (ref 3.4–10.8)

## 2017-12-24 LAB — CMP14+EGFR
ALBUMIN: 4.9 g/dL (ref 3.5–5.5)
ALT: 29 IU/L (ref 0–32)
AST: 25 IU/L (ref 0–40)
Albumin/Globulin Ratio: 2.7 — ABNORMAL HIGH (ref 1.2–2.2)
Alkaline Phosphatase: 46 IU/L (ref 39–117)
BILIRUBIN TOTAL: 0.5 mg/dL (ref 0.0–1.2)
BUN / CREAT RATIO: 11 (ref 9–23)
BUN: 9 mg/dL (ref 6–24)
CO2: 24 mmol/L (ref 20–29)
CREATININE: 0.84 mg/dL (ref 0.57–1.00)
Calcium: 10.3 mg/dL — ABNORMAL HIGH (ref 8.7–10.2)
Chloride: 97 mmol/L (ref 96–106)
GFR calc non Af Amer: 81 mL/min/{1.73_m2} (ref >59)
GFR, EST AFRICAN AMERICAN: 94 mL/min/{1.73_m2} (ref >59)
GLUCOSE: 99 mg/dL (ref 65–99)
Globulin, Total: 1.8 g/dL (ref 1.5–4.5)
Potassium: 4.2 mmol/L (ref 3.5–5.2)
Sodium: 138 mmol/L (ref 134–144)
TOTAL PROTEIN: 6.7 g/dL (ref 6.0–8.5)

## 2017-12-24 LAB — LIPID PANEL
CHOL/HDL RATIO: 3.7 ratio (ref 0.0–4.4)
Cholesterol, Total: 216 mg/dL — ABNORMAL HIGH (ref 100–199)
HDL: 59 mg/dL (ref >39)
LDL CALC: 125 mg/dL — AB (ref 0–99)
Triglycerides: 162 mg/dL — ABNORMAL HIGH (ref 0–149)
VLDL CHOLESTEROL CAL: 32 mg/dL (ref 5–40)

## 2018-02-14 ENCOUNTER — Other Ambulatory Visit: Payer: Self-pay | Admitting: Family Medicine

## 2018-02-28 DIAGNOSIS — Z1231 Encounter for screening mammogram for malignant neoplasm of breast: Secondary | ICD-10-CM | POA: Diagnosis not present

## 2018-02-28 LAB — HM MAMMOGRAPHY

## 2018-03-02 IMAGING — US US CAROTID DUPLEX BILAT
1 series · 13 of 24 positions shown · non-contrast
Comparison: None.

CLINICAL DATA: Left upper extremity heaviness and numbness for 1
day

EXAM:
BILATERAL CAROTID DUPLEX ULTRASOUND
TECHNIQUE: Gray scale imaging, color Doppler and duplex ultrasound were
performed of bilateral carotid and vertebral arteries in the neck.

[Series 1: us carotid duplex bilat · 0.06mm/px · 13 of 68 slices shown]
[im 1/68]
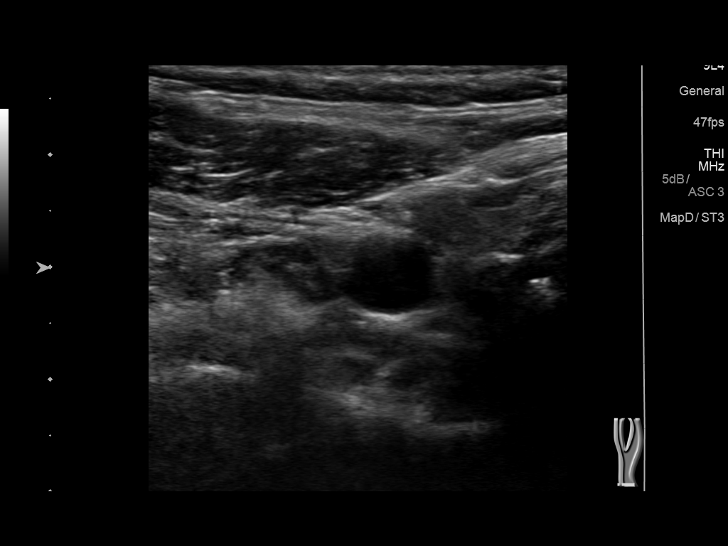
[im 6/68]
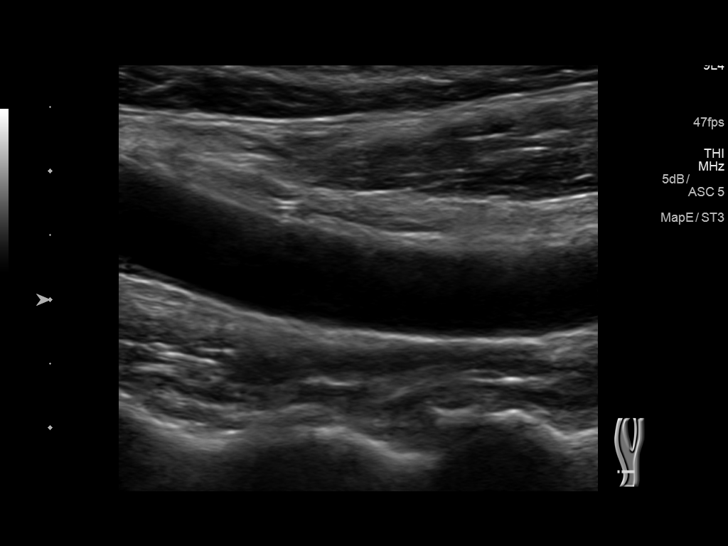
[im 12/68]
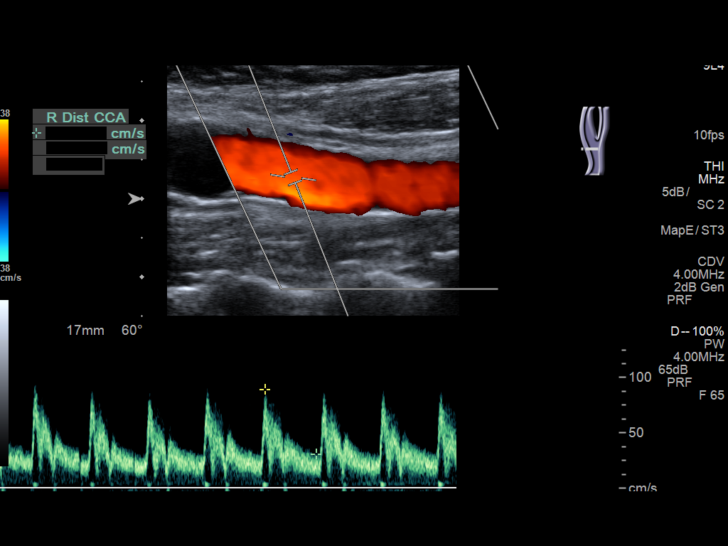
[im 18/68]
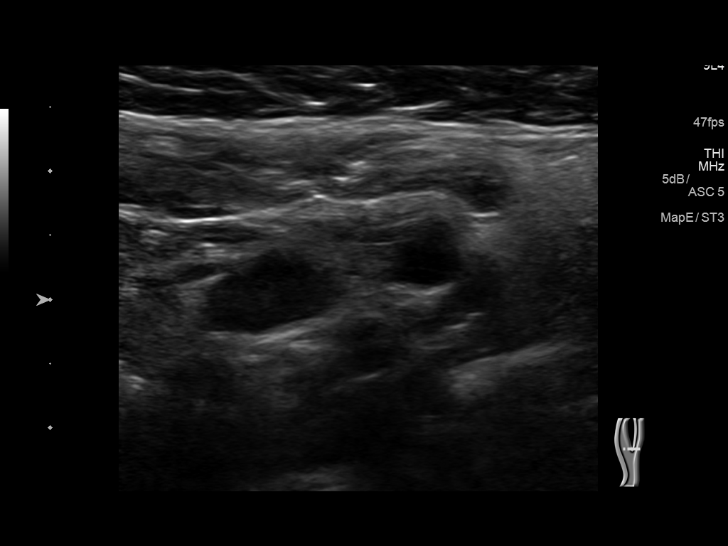
[im 24/68]
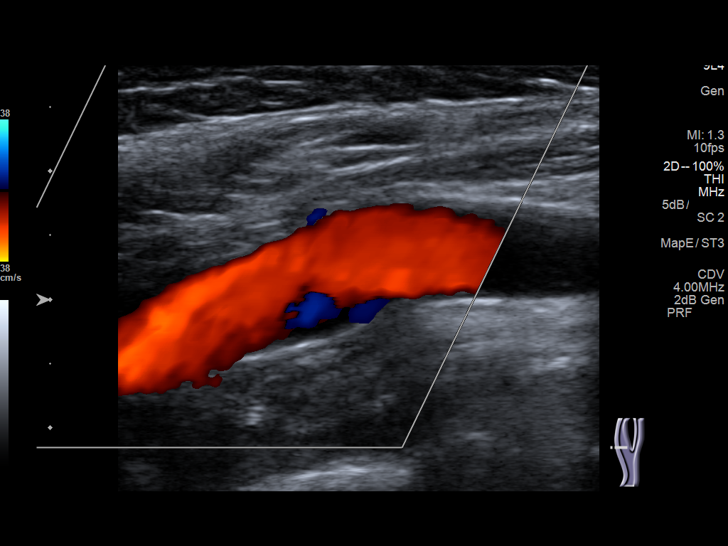
[im 30/68]
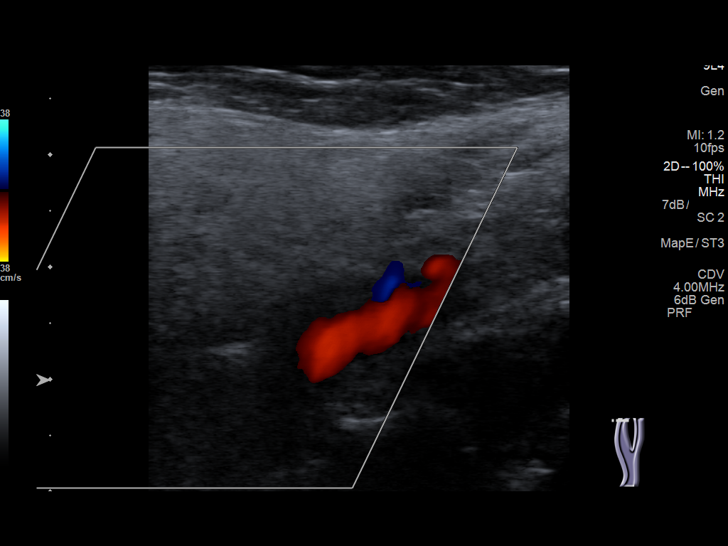
[im 35/68]
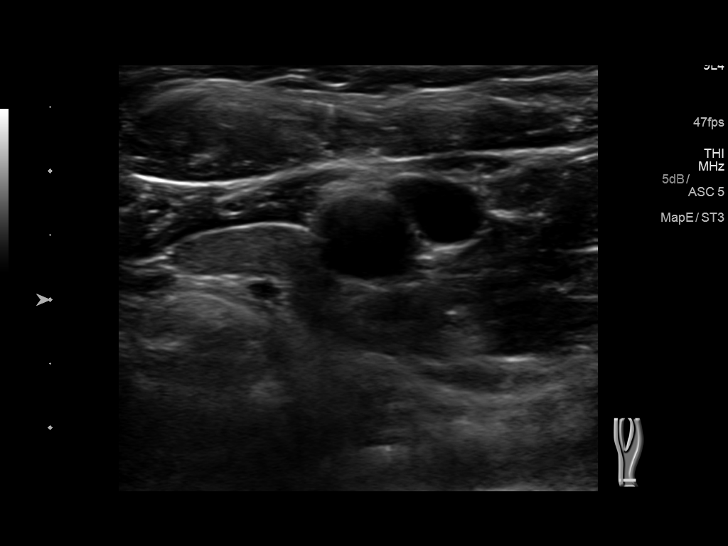
[im 38/68]
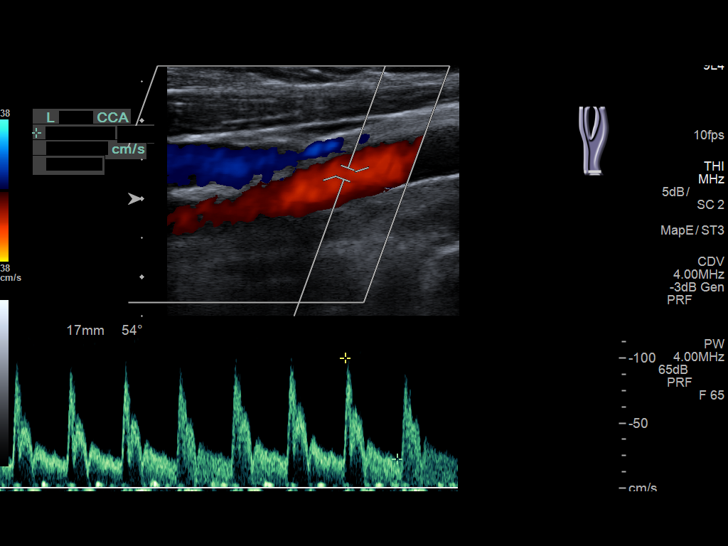
[im 44/68]
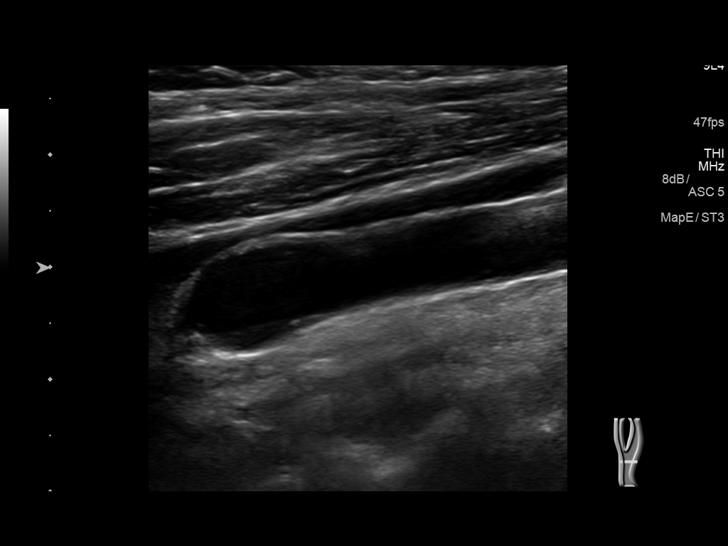
[im 50/68]
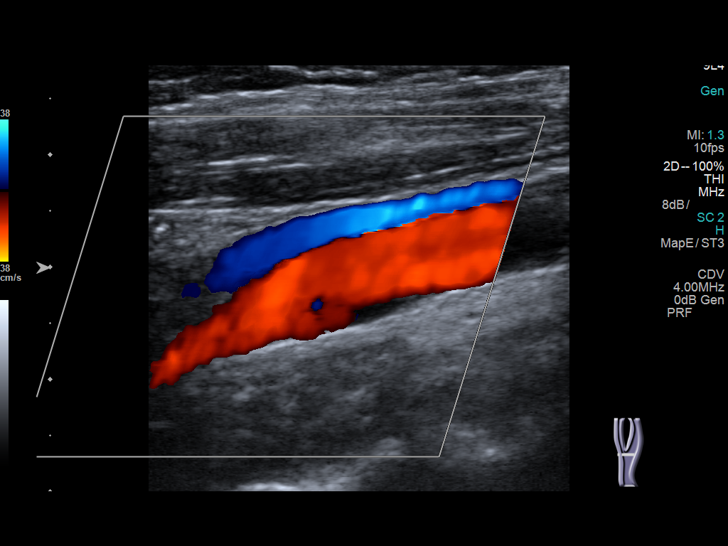
[im 56/68]
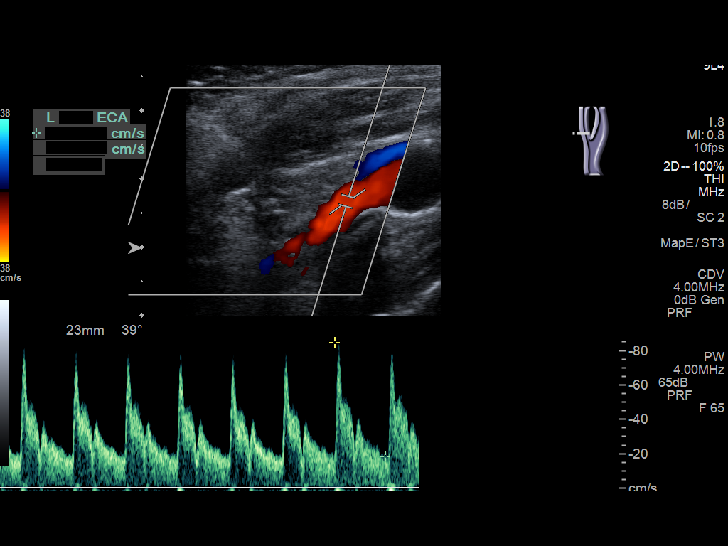
[im 62/68]
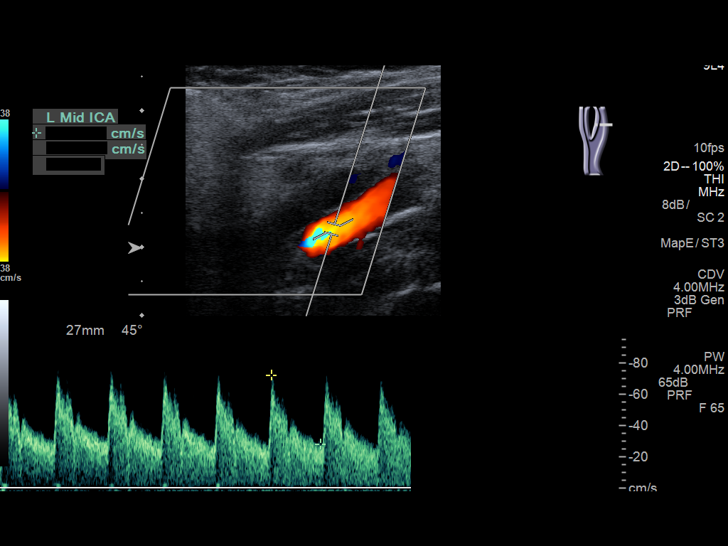
[im 68/68]
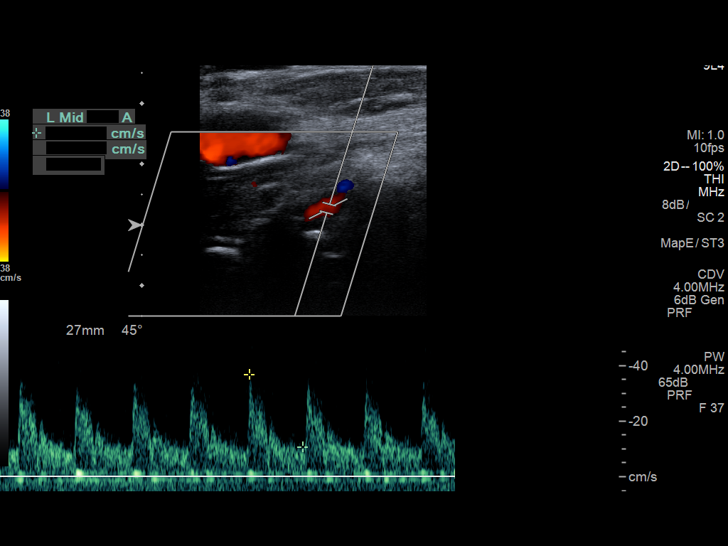

[13 of 24 positions shown; findings below may reference images not displayed]

FINDINGS: Criteria: Quantification of carotid stenosis is based on velocity
parameters that correlate the residual internal carotid diameter
with NASCET-based stenosis levels, using the diameter of the distal
internal carotid lumen as the denominator for stenosis measurement.

The following velocity measurements were obtained:

RIGHT

ICA:  72 cm/sec

CCA:  92 cm/sec

SYSTOLIC ICA/CCA RATIO:

DIASTOLIC ICA/CCA RATIO:

ECA:  77 cm/sec

LEFT

ICA:  79 cm/sec

CCA:  78 cm/sec

SYSTOLIC ICA/CCA RATIO:

DIASTOLIC ICA/CCA RATIO:

ECA:  85 cm/sec

RIGHT CAROTID ARTERY: Minimal mixed smooth plaque along the wall of
the bulb. Low resistance internal carotid Doppler pattern is
preserved.

RIGHT VERTEBRAL ARTERY:  Antegrade.

LEFT CAROTID ARTERY: Mild intimal thickening in the bulb. Low
resistance internal carotid Doppler pattern is preserved.

LEFT VERTEBRAL ARTERY:  Antegrade.
IMPRESSION: Less than 50% stenosis in the right and left internal carotid
arteries.

## 2018-03-08 ENCOUNTER — Other Ambulatory Visit: Payer: Self-pay | Admitting: Women's Health

## 2018-03-20 ENCOUNTER — Ambulatory Visit (INDEPENDENT_AMBULATORY_CARE_PROVIDER_SITE_OTHER): Payer: 59 | Admitting: Family Medicine

## 2018-03-20 ENCOUNTER — Encounter: Payer: Self-pay | Admitting: Family Medicine

## 2018-03-20 VITALS — BP 130/95 | HR 82 | Temp 97.0°F | Ht 63.0 in | Wt 165.6 lb

## 2018-03-20 DIAGNOSIS — B370 Candidal stomatitis: Secondary | ICD-10-CM | POA: Diagnosis not present

## 2018-03-20 DIAGNOSIS — Z01419 Encounter for gynecological examination (general) (routine) without abnormal findings: Secondary | ICD-10-CM

## 2018-03-20 MED ORDER — OMEPRAZOLE 20 MG PO CPDR: 20.0000 mg | DELAYED_RELEASE_CAPSULE | Freq: Every day | ORAL | 3 refills | Status: DC

## 2018-03-20 MED ORDER — LISINOPRIL-HYDROCHLOROTHIAZIDE 10-12.5 MG PO TABS: 1.0000 | ORAL_TABLET | Freq: Every day | ORAL | 3 refills | Status: DC

## 2018-03-20 MED ORDER — FENOFIBRATE 160 MG PO TABS: 160.0000 mg | ORAL_TABLET | Freq: Every day | ORAL | 3 refills | Status: DC

## 2018-03-20 MED ORDER — FLUCONAZOLE 150 MG PO TABS: 150.0000 mg | ORAL_TABLET | Freq: Once | ORAL | 0 refills | Status: AC

## 2018-03-20 MED ORDER — ATORVASTATIN CALCIUM 20 MG PO TABS: 20.0000 mg | ORAL_TABLET | Freq: Every day | ORAL | 3 refills | Status: DC

## 2018-03-20 NOTE — Addendum Note (Signed)
Addended by: Angela AdamOSTOSKY, Seung Nidiffer C on: 03/20/2018 04:39 PM   Modules accepted: Orders

## 2018-03-20 NOTE — Progress Notes (Signed)
BP (!) 130/95   Pulse 82   Temp (!) 97 F (36.1 C) (Oral)   Ht 5\' 3"  (1.6 m)   Wt 165 lb 9.6 oz (75.1 kg)   BMI 29.33 kg/m    Subjective:    Patient ID: Alexis [name], female    DOB: 11-Apr-1966, 52 y.o.   MRN: [id number]  HPI: Alexis [name] is a 52 y.o. female presenting on 03/20/2018 for Gynecologic Exam   HPI Woman exam and gynecological exam She is coming in today for well woman exam and Pap smear.  She said she got her mammogram a couple weeks ago but has not gotten the results and we will track them down for her.  She denies any major health issues except that she currently has some nasal drainage and posterior nasal drainage that has been going on over the past week.  She says that she has had lots of illnesses going around at her workplace and she thinks she may have got something from there.  She does have concerns about not being able to lose weight like she used to be able to and wants to know why that would happen.  Her blood work is all normal from 2 months ago.  We discussed lifestyle changes and activity increases due to metabolic slowdown with aging. Patient denies any chest pain, shortness of breath, headaches or vision issues, abdominal complaints, diarrhea, nausea, vomiting, or joint issues.   Relevant past medical, surgical, family and social history reviewed and updated as indicated. Interim medical history since our last visit reviewed. Allergies and medications reviewed and updated.  Review of Systems  Constitutional: Negative for chills and fever.  HENT: Positive for congestion and sore throat (Congestion). Negative for ear discharge, ear pain, sinus pressure, sinus pain and sneezing.   Eyes: Negative for visual disturbance.  Respiratory: Positive for cough. Negative for chest tightness and shortness of breath.   Cardiovascular: Negative for chest pain and leg swelling.  Gastrointestinal: Negative for abdominal pain.  Genitourinary: Negative for difficulty  urinating and dysuria.  Musculoskeletal: Negative for back pain and gait problem.  Skin: Negative for rash.  Neurological: Negative for light-headedness and headaches.  Psychiatric/Behavioral: Negative for agitation and behavioral problems.  All other systems reviewed and are negative.   Per HPI unless specifically indicated above   Allergies as of 03/20/2018      Reactions   Azithromycin Hives   After a few days   Codeine Hives   Penicillins Hives   Has patient had a PCN reaction causing immediate rash, facial/tongue/throat swelling, SOB or lightheadedness with hypotension: NO Has patient had a PCN reaction causing severe rash involving mucus membranes or skin necrosis: No Has patient had a PCN reaction that required hospitalization: No Has patient had a PCN reaction occurring within the last 10 years: No If all of the above answers are "NO", then may proceed with Cephalosporin use.   Amlodipine Swelling   Pedal edema   Tetracyclines & Related Nausea And Vomiting   GI distress   Tramadol Itching   Side effect      Medication List        Accurate as of 03/20/18  3:09 PM. Always use your most recent med list.          atorvastatin 20 MG tablet Commonly known as:  LIPITOR Take 1 tablet (20 mg total) by mouth daily at 6 PM.   Black Cohosh 540 MG Caps Take 1 capsule by mouth daily.  cyclobenzaprine 5 MG tablet Commonly known as:  FLEXERIL Take 1 tablet (5 mg total) by mouth 3 (three) times daily as needed for muscle spasms.   fenofibrate 160 MG tablet Take 1 tablet (160 mg total) by mouth daily.   fexofenadine 180 MG tablet Commonly known as:  ALLEGRA Take 180 mg by mouth daily as needed for allergies.   fluconazole 150 MG tablet Commonly known as:  DIFLUCAN Take 1 tablet (150 mg total) by mouth once for 1 dose.   lisinopril-hydrochlorothiazide 10-12.5 MG tablet Commonly known as:  PRINZIDE,ZESTORETIC Take 1 tablet by mouth daily.   Melatonin 3 MG  Tabs Take 3 mg by mouth at bedtime.   omeprazole 20 MG capsule Commonly known as:  PRILOSEC Take 1 capsule (20 mg total) by mouth daily.   PROBIOTIC ACIDOPHILUS PO Take 1 capsule by mouth daily.          Objective:    BP (!) 130/95   Pulse 82   Temp (!) 97 F (36.1 C) (Oral)   Ht 5\' 3"  (1.6 m)   Wt 165 lb 9.6 oz (75.1 kg)   BMI 29.33 kg/m   Wt Readings from Last 3 Encounters:  03/20/18 165 lb 9.6 oz (75.1 kg)  12/23/17 162 lb (73.5 kg)  06/23/17 163 lb 1.3 oz (74 kg)    Physical Exam  Constitutional: She is oriented to person, place, and time. She appears well-developed and well-nourished. No distress.  HENT:  Mouth/Throat: Mucous membranes are normal. Oropharyngeal exudate (White patchy discharge in the posterior oropharynx) and posterior oropharyngeal edema present. No posterior oropharyngeal erythema. Tonsils are 0 on the right. Tonsils are 0 on the left.  Eyes: Pupils are equal, round, and reactive to light. Conjunctivae and EOM are normal.  Cardiovascular: Normal rate, regular rhythm, normal heart sounds and intact distal pulses.  No murmur heard. Pulmonary/Chest: Effort normal and breath sounds normal. No respiratory distress. She has no wheezes.  Musculoskeletal: Normal range of motion. She exhibits no edema or tenderness.  Neurological: She is alert and oriented to person, place, and time. Coordination normal.  Skin: Skin is warm and dry. No rash noted. She is not diaphoretic.  Psychiatric: She has a normal mood and affect. Her behavior is normal.  Nursing note and vitals reviewed.       Assessment & Plan:   Problem List Items Addressed This Visit    None    Visit Diagnoses    Well woman exam with routine gynecological exam    -  Primary   Oral thrush       Relevant Medications   fluconazole (DIFLUCAN) 150 MG tablet   Other Relevant Orders   HIV Antibody (routine testing w rflx)       Follow up plan: Return if symptoms worsen or fail to  improve.  Counseling provided for all of the vaccine components Orders Placed This Encounter  Procedures  . HIV Antibody (routine testing w rflx)    Arville Care, MD [facility] 03/20/2018, 3:09 PM

## 2018-03-20 NOTE — Addendum Note (Signed)
Addended by: Arville CareETTINGER, JOSHUA on: 03/20/2018 04:03 PM   Modules accepted: Orders

## 2018-03-21 LAB — HIV ANTIBODY (ROUTINE TESTING W REFLEX): HIV Screen 4th Generation wRfx: NONREACTIVE

## 2018-03-22 LAB — PAP IG W/ RFLX HPV ASCU

## 2018-06-15 ENCOUNTER — Encounter: Payer: Self-pay | Admitting: Family Medicine

## 2018-06-15 ENCOUNTER — Ambulatory Visit (INDEPENDENT_AMBULATORY_CARE_PROVIDER_SITE_OTHER): Payer: 59 | Admitting: Family Medicine

## 2018-06-15 VITALS — BP 121/87 | HR 89 | Temp 97.4°F | Ht 63.0 in | Wt 165.8 lb

## 2018-06-15 DIAGNOSIS — E663 Overweight: Secondary | ICD-10-CM | POA: Diagnosis not present

## 2018-06-15 DIAGNOSIS — I10 Essential (primary) hypertension: Secondary | ICD-10-CM

## 2018-06-15 DIAGNOSIS — K219 Gastro-esophageal reflux disease without esophagitis: Secondary | ICD-10-CM | POA: Diagnosis not present

## 2018-06-15 DIAGNOSIS — Z23 Encounter for immunization: Secondary | ICD-10-CM

## 2018-06-15 DIAGNOSIS — E782 Mixed hyperlipidemia: Secondary | ICD-10-CM | POA: Diagnosis not present

## 2018-06-15 LAB — CMP14+EGFR
A/G RATIO: 3 — AB (ref 1.2–2.2)
ALK PHOS: 46 IU/L (ref 39–117)
ALT: 25 IU/L (ref 0–32)
AST: 22 IU/L (ref 0–40)
Albumin: 4.8 g/dL (ref 3.8–4.9)
BILIRUBIN TOTAL: 0.4 mg/dL (ref 0.0–1.2)
BUN/Creatinine Ratio: 15 (ref 9–23)
BUN: 13 mg/dL (ref 6–24)
CO2: 24 mmol/L (ref 20–29)
Calcium: 10.2 mg/dL (ref 8.7–10.2)
Chloride: 99 mmol/L (ref 96–106)
Creatinine, Ser: 0.84 mg/dL (ref 0.57–1.00)
GFR calc Af Amer: 93 mL/min/{1.73_m2} (ref >59)
GFR calc non Af Amer: 81 mL/min/{1.73_m2} (ref >59)
GLOBULIN, TOTAL: 1.6 g/dL (ref 1.5–4.5)
Glucose: 107 mg/dL — ABNORMAL HIGH (ref 65–99)
POTASSIUM: 4 mmol/L (ref 3.5–5.2)
SODIUM: 139 mmol/L (ref 134–144)
Total Protein: 6.4 g/dL (ref 6.0–8.5)

## 2018-06-15 LAB — LIPID PANEL
CHOL/HDL RATIO: 3.6 ratio (ref 0.0–4.4)
CHOLESTEROL TOTAL: 198 mg/dL (ref 100–199)
HDL: 55 mg/dL (ref >39)
LDL Calculated: 111 mg/dL — ABNORMAL HIGH (ref 0–99)
TRIGLYCERIDES: 160 mg/dL — AB (ref 0–149)
VLDL Cholesterol Cal: 32 mg/dL (ref 5–40)

## 2018-06-15 LAB — CBC WITH DIFFERENTIAL/PLATELET
BASOS: 1 %
Basophils Absolute: 0 10*3/uL (ref 0.0–0.2)
EOS (ABSOLUTE): 0.2 10*3/uL (ref 0.0–0.4)
EOS: 3 %
HEMATOCRIT: 43.2 % (ref 34.0–46.6)
Hemoglobin: 14.8 g/dL (ref 11.1–15.9)
Immature Grans (Abs): 0 10*3/uL (ref 0.0–0.1)
Immature Granulocytes: 0 %
LYMPHS ABS: 2.4 10*3/uL (ref 0.7–3.1)
Lymphs: 44 %
MCH: 31 pg (ref 26.6–33.0)
MCHC: 34.3 g/dL (ref 31.5–35.7)
MCV: 90 fL (ref 79–97)
MONOS ABS: 0.4 10*3/uL (ref 0.1–0.9)
Monocytes: 7 %
Neutrophils Absolute: 2.5 10*3/uL (ref 1.4–7.0)
Neutrophils: 45 %
Platelets: 300 10*3/uL (ref 150–450)
RBC: 4.78 x10E6/uL (ref 3.77–5.28)
RDW: 11.8 % (ref 11.7–15.4)
WBC: 5.5 10*3/uL (ref 3.4–10.8)

## 2018-06-15 NOTE — Addendum Note (Signed)
Addended by: Angela Adam on: 06/15/2018 08:33 AM   Modules accepted: Orders

## 2018-06-15 NOTE — Progress Notes (Signed)
BP 121/87   Pulse 89   Temp (!) 97.4 F (36.3 C) (Oral)   Ht '5\' 3"'  (1.6 m)   Wt 165 lb 12.8 oz (75.2 kg)   BMI 29.37 kg/m    Subjective:    Patient ID: Alexis [name], female    DOB: [birth year], 53 y.o.   MRN: [id number]  HPI: Alexis [name] is a 53 y.o. female presenting on 06/15/2018 for Hypertension (6 month follow up) and Hyperlipidemia   HPI Hypertension Patient is currently on lisinopril hydrochlorothiazide, and their blood pressure today is 121/87. Patient denies any lightheadedness or dizziness. Patient denies headaches, blurred vision, chest pains, shortness of breath, or weakness. Denies any side effects from medication and is content with current medication.   Hyperlipidemia Patient is coming in for recheck of his hyperlipidemia. The patient is currently taking Lipitor and fenofibrate. They deny any issues with myalgias or history of liver damage from it. They deny any focal numbness or weakness or chest pain.   GERD Patient is currently on omeprazole.  She denies any major symptoms or abdominal pain or belching or burping. She denies any blood in her stool or lightheadedness or dizziness.   Relevant past medical, surgical, family and social history reviewed and updated as indicated. Interim medical history since our last visit reviewed. Allergies and medications reviewed and updated.  Review of Systems  Constitutional: Negative for chills and fever.  HENT: Negative for congestion, ear discharge and ear pain.   Eyes: Negative for redness and visual disturbance.  Respiratory: Negative for chest tightness and shortness of breath.   Cardiovascular: Negative for chest pain and leg swelling.  Gastrointestinal: Negative for abdominal pain.  Musculoskeletal: Negative for back pain and gait problem.  Skin: Negative for rash.  Neurological: Negative for dizziness, light-headedness and headaches.  Psychiatric/Behavioral: Negative for agitation and behavioral problems.    All other systems reviewed and are negative.   Per HPI unless specifically indicated above   Allergies as of 06/15/2018      Reactions   Azithromycin [name]   After a few days   Codeine [name]   Penicillins [name]   Has patient had a PCN reaction causing immediate rash, facial/tongue/throat swelling, SOB or lightheadedness with hypotension: NO Has patient had a PCN reaction causing severe rash involving mucus membranes or skin necrosis: No Has patient had a PCN reaction that required hospitalization: No Has patient had a PCN reaction occurring within the last 10 years: No If all of the above answers are "NO", then may proceed with Cephalosporin use.   Amlodipine Swelling   Pedal edema   Tetracyclines & Related Nausea And Vomiting   GI distress   Tramadol Itching   Side effect      Medication List       Accurate as of June 15, 2018  8:28 AM. Always use your most recent med list.        aspirin EC 81 MG tablet Take 81 mg by mouth daily.   atorvastatin 20 MG tablet Commonly known as:  LIPITOR Take 1 tablet (20 mg total) by mouth daily at 6 PM.   Black Cohosh 540 MG Caps Take 1 capsule by mouth daily.   cyclobenzaprine 5 MG tablet Commonly known as:  FLEXERIL Take 1 tablet (5 mg total) by mouth 3 (three) times daily as needed for muscle spasms.   fenofibrate 160 MG tablet Take 1 tablet (160 mg total) by mouth daily.   fexofenadine 180 MG tablet Commonly known as:  ALLEGRA Take 180 mg by mouth daily as needed for allergies.   lisinopril-hydrochlorothiazide 10-12.5 MG tablet Commonly known as:  PRINZIDE,ZESTORETIC Take 1 tablet by mouth daily.   Melatonin 3 MG Tabs Take 3 mg by mouth at bedtime.   omeprazole 20 MG capsule Commonly known as:  PRILOSEC Take 1 capsule (20 mg total) by mouth daily.   PROBIOTIC ACIDOPHILUS PO Take 1 capsule by mouth daily.          Objective:    BP 121/87   Pulse 89   Temp (!) 97.4 F (36.3 C) (Oral)   Ht '5\' 3"'  (1.6  m)   Wt 165 lb 12.8 oz (75.2 kg)   BMI 29.37 kg/m   Wt Readings from Last 3 Encounters:  06/15/18 165 lb 12.8 oz (75.2 kg)  03/20/18 165 lb 9.6 oz (75.1 kg)  12/23/17 162 lb (73.5 kg)    Physical Exam Vitals signs and nursing note reviewed.  Constitutional:      General: She is not in acute distress.    Appearance: She is well-developed. She is not diaphoretic.  Eyes:     Conjunctiva/sclera: Conjunctivae normal.  Cardiovascular:     Rate and Rhythm: Normal rate and regular rhythm.     Heart sounds: Normal heart sounds. No murmur.  Pulmonary:     Effort: Pulmonary effort is normal. No respiratory distress.     Breath sounds: Normal breath sounds. No wheezing.  Musculoskeletal: Normal range of motion.        General: No tenderness.  Skin:    General: Skin is warm and dry.     Findings: No rash.  Neurological:     Mental Status: She is alert and oriented to person, place, and time.     Coordination: Coordination normal.  Psychiatric:        Behavior: Behavior normal.     Results for orders placed or performed in visit on 03/27/18  HM MAMMOGRAPHY  Result Value Ref Range   HM Mammogram 0-4 Bi-Rad 0-4 Bi-Rad, Self Reported Normal      Assessment & Plan:   Problem List Items Addressed This Visit      Cardiovascular and Mediastinum   Essential hypertension   Relevant Medications   aspirin EC 81 MG tablet   Other Relevant Orders   CMP14+EGFR     Digestive   GERD (gastroesophageal reflux disease)   Relevant Orders   CBC with Differential/Platelet   CMP14+EGFR     Other   Mixed hyperlipidemia - Primary   Relevant Medications   aspirin EC 81 MG tablet   Other Relevant Orders   Lipid panel   Overweight (BMI 25.0-29.9)      Continue current medications including Lipitor and fenofibrate and lisinopril hydrochlorothiazide and omeprazole, check blood work today, patient will stop her baby aspirin because she has had a bleeding episode recently and we discussed the  positives and negatives with more recent studies and she will just go ahead and stop it. Follow up plan: Return in about 6 months (around 12/14/2018), or if symptoms worsen or fail to improve, for Cholesterol and GERD and hypertension.  Counseling provided for all of the vaccine components Orders Placed This Encounter  Procedures  . CBC with Differential/Platelet  . CMP14+EGFR  . Lipid panel    Caryl Pina, MD [facility] 06/15/2018, 8:28 AM

## 2018-06-28 DIAGNOSIS — Z719 Counseling, unspecified: Secondary | ICD-10-CM | POA: Diagnosis not present

## 2018-07-05 DIAGNOSIS — Z719 Counseling, unspecified: Secondary | ICD-10-CM | POA: Diagnosis not present

## 2018-07-11 DIAGNOSIS — Z719 Counseling, unspecified: Secondary | ICD-10-CM | POA: Diagnosis not present

## 2018-07-18 DIAGNOSIS — Z719 Counseling, unspecified: Secondary | ICD-10-CM | POA: Diagnosis not present

## 2018-07-25 DIAGNOSIS — Z719 Counseling, unspecified: Secondary | ICD-10-CM | POA: Diagnosis not present

## 2018-08-01 DIAGNOSIS — Z719 Counseling, unspecified: Secondary | ICD-10-CM | POA: Diagnosis not present

## 2018-08-10 DIAGNOSIS — Z719 Counseling, unspecified: Secondary | ICD-10-CM | POA: Diagnosis not present

## 2018-08-17 DIAGNOSIS — Z719 Counseling, unspecified: Secondary | ICD-10-CM | POA: Diagnosis not present

## 2018-08-22 DIAGNOSIS — Z719 Counseling, unspecified: Secondary | ICD-10-CM | POA: Diagnosis not present

## 2018-08-31 DIAGNOSIS — Z719 Counseling, unspecified: Secondary | ICD-10-CM | POA: Diagnosis not present

## 2018-09-07 DIAGNOSIS — Z719 Counseling, unspecified: Secondary | ICD-10-CM | POA: Diagnosis not present

## 2018-09-12 DIAGNOSIS — Z719 Counseling, unspecified: Secondary | ICD-10-CM | POA: Diagnosis not present

## 2018-09-19 DIAGNOSIS — Z719 Counseling, unspecified: Secondary | ICD-10-CM | POA: Diagnosis not present

## 2018-12-14 ENCOUNTER — Other Ambulatory Visit: Payer: Self-pay

## 2018-12-15 ENCOUNTER — Encounter: Payer: Self-pay | Admitting: Family Medicine

## 2018-12-15 ENCOUNTER — Ambulatory Visit (INDEPENDENT_AMBULATORY_CARE_PROVIDER_SITE_OTHER): Payer: 59 | Admitting: Family Medicine

## 2018-12-15 VITALS — BP 121/87 | HR 69 | Temp 97.8°F | Ht 63.0 in | Wt 161.8 lb

## 2018-12-15 DIAGNOSIS — K219 Gastro-esophageal reflux disease without esophagitis: Secondary | ICD-10-CM

## 2018-12-15 DIAGNOSIS — I10 Essential (primary) hypertension: Secondary | ICD-10-CM

## 2018-12-15 DIAGNOSIS — E663 Overweight: Secondary | ICD-10-CM

## 2018-12-15 DIAGNOSIS — E782 Mixed hyperlipidemia: Secondary | ICD-10-CM

## 2018-12-15 DIAGNOSIS — R7309 Other abnormal glucose: Secondary | ICD-10-CM

## 2018-12-15 MED ORDER — ATORVASTATIN CALCIUM 20 MG PO TABS: 20.0000 mg | ORAL_TABLET | Freq: Every day | ORAL | 3 refills | Status: DC

## 2018-12-15 MED ORDER — FENOFIBRATE 160 MG PO TABS: 160.0000 mg | ORAL_TABLET | Freq: Every day | ORAL | 3 refills | Status: DC

## 2018-12-15 MED ORDER — OMEPRAZOLE 20 MG PO CPDR: 20.0000 mg | DELAYED_RELEASE_CAPSULE | Freq: Every day | ORAL | 3 refills | Status: DC

## 2018-12-15 MED ORDER — LISINOPRIL-HYDROCHLOROTHIAZIDE 10-12.5 MG PO TABS: 1.0000 | ORAL_TABLET | Freq: Every day | ORAL | 3 refills | Status: DC

## 2018-12-15 NOTE — Progress Notes (Signed)
BP 121/87   Pulse 69   Temp 97.8 F (36.6 C) (Temporal)   Ht _0  (1.6 m)   Wt 161 lb 12.8 oz (73.4 kg)   BMI 28.66 kg/m    Subjective:   Patient ID: Alexis [name], female    DOB: 07-Sep-1965, 53 y.o.   MRN: [id number]  HPI: Alexis [name] is a 53 y.o. female presenting on 12/15/2018 for Hyperlipidemia (6 month follow up)   HPI Hyperlipidemia Patient is coming in for recheck of his hyperlipidemia. The patient is currently taking atorvastatin and fenofibrate. They deny any issues with myalgias or history of liver damage from it. They deny any focal numbness or weakness or chest pain.  Discussed being heart healthy and keeping active and maintaining good nutrition with the patient  GERD Patient is currently on omeprazole.  She denies any major symptoms or abdominal pain or belching or burping. She denies any blood in her stool or lightheadedness or dizziness.   Hypertension Patient is currently on lisinopril-hydrochlorothiazide, and their blood pressure today is 121/87. Patient denies any lightheadedness or dizziness. Patient denies headaches, blurred vision, chest pains, shortness of breath, or weakness. Denies any side effects from medication and is content with current medication.   Relevant past medical, surgical, family and social history reviewed and updated as indicated. Interim medical history since our last visit reviewed. Allergies and medications reviewed and updated.  Review of Systems  Constitutional: Negative for chills and fever.  HENT: Negative for congestion, ear discharge and ear pain.   Eyes: Negative for redness and visual disturbance.  Respiratory: Negative for chest tightness and shortness of breath.   Cardiovascular: Negative for chest pain and leg swelling.  Genitourinary: Negative for difficulty urinating and dysuria.  Musculoskeletal: Negative for back pain and gait problem.  Skin: Negative for rash.  Neurological: Negative for dizziness,  light-headedness and headaches.  Psychiatric/Behavioral: Negative for agitation and behavioral problems.  All other systems reviewed and are negative.   Per HPI unless specifically indicated above   Allergies as of 12/15/2018      Reactions   Azithromycin [name]   After a few days   Codeine [name]   Penicillins [name]   Has patient had a PCN reaction causing immediate rash, facial/tongue/throat swelling, SOB or lightheadedness with hypotension: NO Has patient had a PCN reaction causing severe rash involving mucus membranes or skin necrosis: No Has patient had a PCN reaction that required hospitalization: No Has patient had a PCN reaction occurring within the last 10 years: No If all of the above answers are "NO", then may proceed with Cephalosporin use.   Amlodipine Swelling   Pedal edema   Tetracyclines & Related Nausea And Vomiting   GI distress   Tramadol Itching   Side effect      Medication List       Accurate as of December 15, 2018  8:11 AM. If you have any questions, ask your nurse or doctor.        aspirin EC 81 MG tablet Take 81 mg by mouth daily.   atorvastatin 20 MG tablet Commonly known as: LIPITOR Take 1 tablet (20 mg total) by mouth daily at 6 PM.   Black Cohosh 540 MG Caps Take 1 capsule by mouth daily.   cyclobenzaprine 5 MG tablet Commonly known as: FLEXERIL Take 1 tablet (5 mg total) by mouth 3 (three) times daily as needed for muscle spasms.   fenofibrate 160 MG tablet Take 1 tablet (160 mg total) by  mouth daily.   fexofenadine 180 MG tablet Commonly known as: ALLEGRA Take 180 mg by mouth daily as needed for allergies.   lisinopril-hydrochlorothiazide 10-12.5 MG tablet Commonly known as: ZESTORETIC Take 1 tablet by mouth daily.   Melatonin 3 MG Tabs Take 3 mg by mouth at bedtime.   omeprazole 20 MG capsule Commonly known as: PRILOSEC Take 1 capsule (20 mg total) by mouth daily.   PROBIOTIC ACIDOPHILUS PO Take 1 capsule by mouth daily.         Objective:   BP 121/87   Pulse 69   Temp 97.8 F (36.6 C) (Temporal)   Ht _0  (1.6 m)   Wt 161 lb 12.8 oz (73.4 kg)   BMI 28.66 kg/m   Wt Readings from Last 3 Encounters:  12/15/18 161 lb 12.8 oz (73.4 kg)  06/15/18 165 lb 12.8 oz (75.2 kg)  03/20/18 165 lb 9.6 oz (75.1 kg)    Physical Exam Vitals signs and nursing note reviewed.  Constitutional:      General: She is not in acute distress.    Appearance: She is well-developed. She is not diaphoretic.  Eyes:     Conjunctiva/sclera: Conjunctivae normal.  Cardiovascular:     Rate and Rhythm: Normal rate and regular rhythm.     Heart sounds: Normal heart sounds. No murmur.  Pulmonary:     Effort: Pulmonary effort is normal. No respiratory distress.     Breath sounds: Normal breath sounds. No wheezing.  Musculoskeletal: Normal range of motion.        General: No swelling.  Skin:    General: Skin is warm and dry.     Findings: No rash.  Neurological:     Mental Status: She is alert and oriented to person, place, and time.     Coordination: Coordination normal.  Psychiatric:        Behavior: Behavior normal.       Assessment & Plan:   Problem List Items Addressed This Visit      Cardiovascular and Mediastinum   Essential hypertension   Relevant Medications   atorvastatin (LIPITOR) 20 MG tablet   lisinopril-hydrochlorothiazide (ZESTORETIC) 10-12.5 MG tablet   fenofibrate 160 MG tablet   Other Relevant Orders   CMP14+EGFR     Digestive   GERD (gastroesophageal reflux disease)   Relevant Medications   omeprazole (PRILOSEC) 20 MG capsule     Other   Mixed hyperlipidemia - Primary   Relevant Medications   atorvastatin (LIPITOR) 20 MG tablet   lisinopril-hydrochlorothiazide (ZESTORETIC) 10-12.5 MG tablet   fenofibrate 160 MG tablet   Other Relevant Orders   Lipid panel   Overweight (BMI 25.0-29.9)      No changes in current medication, continue atorvastatin and lisinopril hydrochlorothiazide and  omeprazole  Follow up plan: Return in about 6 months (around 06/17/2019), or if symptoms worsen or fail to improve, for Cholesterol and GERD and hypertension recheck and for yearly physical patient wants.  Counseling provided for all of the vaccine components No orders of the defined types were placed in this encounter.   Caryl Pina, MD [facility] 12/15/2018, 8:11 AM

## 2018-12-16 LAB — CMP14+EGFR
ALT: 22 IU/L (ref 0–32)
AST: 19 IU/L (ref 0–40)
Albumin/Globulin Ratio: 2.9 — ABNORMAL HIGH (ref 1.2–2.2)
Albumin: 4.7 g/dL (ref 3.8–4.9)
Alkaline Phosphatase: 62 IU/L (ref 39–117)
BUN/Creatinine Ratio: 14 (ref 9–23)
BUN: 11 mg/dL (ref 6–24)
Bilirubin Total: 0.3 mg/dL (ref 0.0–1.2)
CO2: 24 mmol/L (ref 20–29)
Calcium: 10.1 mg/dL (ref 8.7–10.2)
Chloride: 101 mmol/L (ref 96–106)
Creatinine, Ser: 0.81 mg/dL (ref 0.57–1.00)
GFR calc Af Amer: 97 mL/min/{1.73_m2} (ref >59)
GFR calc non Af Amer: 84 mL/min/{1.73_m2} (ref >59)
Globulin, Total: 1.6 g/dL (ref 1.5–4.5)
Glucose: 111 mg/dL — ABNORMAL HIGH (ref 65–99)
Potassium: 4.2 mmol/L (ref 3.5–5.2)
Sodium: 141 mmol/L (ref 134–144)
Total Protein: 6.3 g/dL (ref 6.0–8.5)

## 2018-12-16 LAB — LIPID PANEL
Chol/HDL Ratio: 3.6 ratio (ref 0.0–4.4)
Cholesterol, Total: 193 mg/dL (ref 100–199)
HDL: 54 mg/dL (ref >39)
LDL Calculated: 107 mg/dL — ABNORMAL HIGH (ref 0–99)
Triglycerides: 162 mg/dL — ABNORMAL HIGH (ref 0–149)
VLDL Cholesterol Cal: 32 mg/dL (ref 5–40)

## 2018-12-19 LAB — BAYER DCA HB A1C WAIVED: HB A1C (BAYER DCA - WAIVED): 5.9 % (ref <7.0)

## 2018-12-19 NOTE — Addendum Note (Signed)
Addended by: Nigel Berthold C on: 12/19/2018 08:16 AM   Modules accepted: Orders

## 2019-01-25 ENCOUNTER — Other Ambulatory Visit: Payer: Self-pay

## 2019-01-25 DIAGNOSIS — Z20822 Contact with and (suspected) exposure to covid-19: Secondary | ICD-10-CM

## 2019-01-26 LAB — NOVEL CORONAVIRUS, NAA: SARS-CoV-2, NAA: NOT DETECTED

## 2019-03-22 ENCOUNTER — Other Ambulatory Visit: Payer: Self-pay

## 2019-03-23 ENCOUNTER — Encounter: Payer: Self-pay | Admitting: Family Medicine

## 2019-03-23 ENCOUNTER — Ambulatory Visit (INDEPENDENT_AMBULATORY_CARE_PROVIDER_SITE_OTHER): Payer: 59 | Admitting: Family Medicine

## 2019-03-23 VITALS — BP 116/84 | HR 81 | Temp 98.0°F | Ht 63.0 in | Wt 159.2 lb

## 2019-03-23 DIAGNOSIS — R8290 Unspecified abnormal findings in urine: Secondary | ICD-10-CM | POA: Diagnosis not present

## 2019-03-23 DIAGNOSIS — Z13220 Encounter for screening for lipoid disorders: Secondary | ICD-10-CM

## 2019-03-23 DIAGNOSIS — Z0001 Encounter for general adult medical examination with abnormal findings: Secondary | ICD-10-CM

## 2019-03-23 DIAGNOSIS — E782 Mixed hyperlipidemia: Secondary | ICD-10-CM

## 2019-03-23 DIAGNOSIS — Z131 Encounter for screening for diabetes mellitus: Secondary | ICD-10-CM

## 2019-03-23 DIAGNOSIS — Z0000 Encounter for general adult medical examination without abnormal findings: Secondary | ICD-10-CM

## 2019-03-23 DIAGNOSIS — I10 Essential (primary) hypertension: Secondary | ICD-10-CM

## 2019-03-23 DIAGNOSIS — K219 Gastro-esophageal reflux disease without esophagitis: Secondary | ICD-10-CM

## 2019-03-23 LAB — MICROSCOPIC EXAMINATION
Renal Epithel, UA: NONE SEEN /hpf
WBC, UA: NONE SEEN /hpf (ref 0–5)

## 2019-03-23 LAB — URINALYSIS, COMPLETE
Bilirubin, UA: NEGATIVE
Glucose, UA: NEGATIVE
Ketones, UA: NEGATIVE
Leukocytes,UA: NEGATIVE
Nitrite, UA: NEGATIVE
Protein,UA: NEGATIVE
Specific Gravity, UA: 1.015 (ref 1.005–1.030)
Urobilinogen, Ur: 0.2 mg/dL (ref 0.2–1.0)
pH, UA: 7.5 (ref 5.0–7.5)

## 2019-03-23 MED ORDER — CYCLOBENZAPRINE HCL 5 MG PO TABS: 5.0000 mg | ORAL_TABLET | Freq: Three times a day (TID) | ORAL | 3 refills | Status: DC | PRN

## 2019-03-23 MED ORDER — LISINOPRIL 10 MG PO TABS: 10.0000 mg | ORAL_TABLET | Freq: Every day | ORAL | 1 refills | Status: DC

## 2019-03-23 NOTE — Progress Notes (Signed)
BP 116/84   Pulse 81   Temp 98 F (36.7 C) (Temporal)   Ht 5' 3" (1.6 m)   Wt 159 lb 3.2 oz (72.2 kg)   SpO2 95%   BMI 28.20 kg/m    Subjective:   Patient ID: Alexis [name], female    DOB: [birth year], 53 y.o.   MRN: [id number]  HPI: [name] is a 53 y.o. female presenting on 03/23/2019 for Annual Exam (Patient states that she has been having and off odor with her urine.)   HPI Patient is coming in for adult well exam and physical She says she has been having recurrent on and off urinary odor.  She denies any dysuria or abdominal pain or flank pain. Patient denies any chest pain, shortness of breath, headaches or vision issues, abdominal complaints, diarrhea, nausea, vomiting, or joint issues.  She just had her mammogram and Pap smear earlier this month and had a colonoscopy in 2016 and is up-to-date on her vaccinations  Hyperlipidemia Patient is coming in for recheck of his hyperlipidemia. The patient is currently taking Lipitor and fenofibrate. They deny any issues with myalgias or history of liver damage from it. They deny any focal numbness or weakness or chest pain.   Hypertension Patient is currently on lisinopril, and their blood pressure today is 116/84. Patient denies any lightheadedness or dizziness. Patient denies headaches, blurred vision, chest pains, shortness of breath, or weakness. Denies any side effects from medication and is content with current medication.   GERD Patient is currently on omeprazole.  She denies any major symptoms or abdominal pain or belching or burping. She denies any blood in her stool or lightheadedness or dizziness.   Relevant past medical, surgical, family and social history reviewed and updated as indicated. Interim medical history since our last visit reviewed. Allergies and medications reviewed and updated.  Review of Systems  Constitutional: Negative for chills and fever.  HENT: Negative for ear pain and tinnitus.   Eyes:  Negative for pain.  Respiratory: Negative for cough, shortness of breath and wheezing.   Cardiovascular: Negative for chest pain, palpitations and leg swelling.  Gastrointestinal: Negative for abdominal pain, blood in stool, constipation and diarrhea.  Genitourinary: Negative for difficulty urinating, dysuria, flank pain, hematuria and urgency.  Musculoskeletal: Negative for back pain and myalgias.  Skin: Negative for rash.  Neurological: Negative for dizziness, weakness and headaches.  Psychiatric/Behavioral: Negative for suicidal ideas.    Per HPI unless specifically indicated above   Allergies as of 03/23/2019      Reactions   Azithromycin [name]   After a few days   Codeine [name]   Penicillins [name]   Has patient had a PCN reaction causing immediate rash, facial/tongue/throat swelling, SOB or lightheadedness with hypotension: NO Has patient had a PCN reaction causing severe rash involving mucus membranes or skin necrosis: No Has patient had a PCN reaction that required hospitalization: No Has patient had a PCN reaction occurring within the last 10 years: No If all of the above answers are "NO", then may proceed with Cephalosporin use.   Amlodipine Swelling   Pedal edema   Tetracyclines & Related Nausea And Vomiting   GI distress   Tramadol Itching   Side effect      Medication List       Accurate as of March 23, 2019 10:49 AM. If you have any questions, ask your nurse or doctor.        STOP taking these medications   aspirin  EC 81 MG tablet Stopped by: Fransisca Kaufmann Dettinger, MD     TAKE these medications   atorvastatin 20 MG tablet Commonly known as: LIPITOR Take 1 tablet (20 mg total) by mouth daily at 6 PM.   Black Cohosh 540 MG Caps Take 1 capsule by mouth daily.   cyclobenzaprine 5 MG tablet Commonly known as: FLEXERIL Take 1 tablet (5 mg total) by mouth 3 (three) times daily as needed for muscle spasms.   fenofibrate 160 MG tablet Take 1 tablet (160  mg total) by mouth daily.   fexofenadine 180 MG tablet Commonly known as: ALLEGRA Take 180 mg by mouth daily as needed for allergies.   lisinopril-hydrochlorothiazide 10-12.5 MG tablet Commonly known as: ZESTORETIC Take 1 tablet by mouth daily.   Melatonin 3 MG Tabs Take 3 mg by mouth at bedtime.   omeprazole 20 MG capsule Commonly known as: PRILOSEC Take 1 capsule (20 mg total) by mouth daily.   PROBIOTIC ACIDOPHILUS PO Take 1 capsule by mouth daily.        Objective:   BP 116/84   Pulse 81   Temp 98 F (36.7 C) (Temporal)   Ht 5' 3" (1.6 m)   Wt 159 lb 3.2 oz (72.2 kg)   SpO2 95%   BMI 28.20 kg/m   Wt Readings from Last 3 Encounters:  03/23/19 159 lb 3.2 oz (72.2 kg)  12/15/18 161 lb 12.8 oz (73.4 kg)  06/15/18 165 lb 12.8 oz (75.2 kg)    Physical Exam Vitals signs and nursing note reviewed.  Constitutional:      General: She is not in acute distress.    Appearance: She is well-developed. She is not diaphoretic.  Eyes:     Conjunctiva/sclera: Conjunctivae normal.  Cardiovascular:     Rate and Rhythm: Normal rate and regular rhythm.     Heart sounds: Normal heart sounds. No murmur.  Pulmonary:     Effort: Pulmonary effort is normal. No respiratory distress.     Breath sounds: Normal breath sounds. No wheezing.  Abdominal:     General: Abdomen is flat. Bowel sounds are normal. There is no distension.     Tenderness: There is no abdominal tenderness. There is no guarding or rebound.     Hernia: No hernia is present.  Musculoskeletal: Normal range of motion.        General: No tenderness.  Skin:    General: Skin is warm and dry.     Findings: No rash.  Neurological:     Mental Status: She is alert and oriented to person, place, and time.     Coordination: Coordination normal.  Psychiatric:        Behavior: Behavior normal.     Urinalysis: 0-2 RBCs, 0-10 epithelial cells, few bacteria, trace blood, otherwise negative  Assessment & Plan:   Problem  List Items Addressed This Visit      Cardiovascular and Mediastinum   Essential hypertension   Relevant Medications   lisinopril (ZESTRIL) 10 MG tablet     Digestive   GERD (gastroesophageal reflux disease)     Other   Mixed hyperlipidemia   Relevant Medications   lisinopril (ZESTRIL) 10 MG tablet    Other Visit Diagnoses    Abnormal urine odor    -  Primary   Relevant Orders   urinalysis- dip and micro (Completed)   Urine culture (Completed)   Well adult exam       Relevant Orders   CBC with Differential/Platelet  TSH   Diabetes mellitus screening       Relevant Orders   CBC with Differential/Platelet   CMP14+EGFR   TSH   Lipid screening       Relevant Orders   Lipid panel   TSH      Continue current medication, no change  Urinalysis does not show any signs of infection and she will continue to watch for any worsening or if anything develops she will let us know. Follow up plan: Return in about 6 months (around 09/20/2019), or if symptoms worsen or fail to improve, for Hypertension cholesterol.  Counseling provided for all of the vaccine components Orders Placed This Encounter  Procedures  . Urine culture  . urinalysis- dip and micro  . CBC with Differential/Platelet  . CMP14+EGFR  . Lipid panel  . TSH    Caryl Pina, MD [facility] 03/23/2019, 10:49 AM

## 2019-03-24 LAB — URINE CULTURE: Organism ID, Bacteria: NO GROWTH

## 2019-04-18 ENCOUNTER — Other Ambulatory Visit: Payer: Self-pay | Admitting: Family Medicine

## 2019-09-18 ENCOUNTER — Other Ambulatory Visit: Payer: Self-pay | Admitting: Family Medicine

## 2019-09-18 DIAGNOSIS — I10 Essential (primary) hypertension: Secondary | ICD-10-CM

## 2019-09-21 ENCOUNTER — Ambulatory Visit: Payer: 59 | Admitting: Family Medicine

## 2019-10-31 ENCOUNTER — Encounter: Payer: Self-pay | Admitting: Family Medicine

## 2019-10-31 ENCOUNTER — Ambulatory Visit (INDEPENDENT_AMBULATORY_CARE_PROVIDER_SITE_OTHER): Payer: 59 | Admitting: Family Medicine

## 2019-10-31 ENCOUNTER — Other Ambulatory Visit: Payer: Self-pay

## 2019-10-31 VITALS — BP 119/82 | HR 79 | Temp 97.6°F | Ht 63.0 in | Wt 160.0 lb

## 2019-10-31 DIAGNOSIS — E782 Mixed hyperlipidemia: Secondary | ICD-10-CM

## 2019-10-31 DIAGNOSIS — I10 Essential (primary) hypertension: Secondary | ICD-10-CM

## 2019-10-31 DIAGNOSIS — K219 Gastro-esophageal reflux disease without esophagitis: Secondary | ICD-10-CM | POA: Diagnosis not present

## 2019-10-31 MED ORDER — ATORVASTATIN CALCIUM 20 MG PO TABS: 20.0000 mg | ORAL_TABLET | Freq: Every day | ORAL | 3 refills | Status: DC

## 2019-10-31 MED ORDER — OMEPRAZOLE 20 MG PO CPDR: 20.0000 mg | DELAYED_RELEASE_CAPSULE | Freq: Every day | ORAL | 3 refills | Status: DC

## 2019-10-31 MED ORDER — LISINOPRIL 10 MG PO TABS: 10.0000 mg | ORAL_TABLET | Freq: Every day | ORAL | 3 refills | Status: DC

## 2019-10-31 MED ORDER — FENOFIBRATE 160 MG PO TABS: 160.0000 mg | ORAL_TABLET | Freq: Every day | ORAL | 3 refills | Status: DC

## 2019-10-31 NOTE — Progress Notes (Signed)
BP 119/82   Pulse 79   Temp 97.6 F (36.4 C)   Ht '5\' 3"'  (1.6 m)   Wt 160 lb (72.6 kg)   SpO2 99%   BMI 28.34 kg/m    Subjective:   Patient ID: Alexis [name], female    DOB: 05-12-1965, 54 y.o.   MRN: [id number]  HPI: Alexis [name] is a 54 y.o. female presenting on 10/31/2019 for Medical Management of Chronic Issues, Hypertension, and Hyperlipidemia   HPI Hyperlipidemia Patient is coming in for recheck of his hyperlipidemia. The patient is currently taking atorvastatin and fenofibrate. They deny any issues with myalgias or history of liver damage from it. They deny any focal numbness or weakness or chest pain.   Hypertension Patient is currently on lisinopril, and their blood pressure today is 119/82. Patient denies any lightheadedness or dizziness. Patient denies headaches, blurred vision, chest pains, shortness of breath, or weakness. Denies any side effects from medication and is content with current medication.   Patient's hyperlipidemia and hypertension are more complicated by the patient's obesity.  Discussed weight loss and lifestyle modification and exercise with the patient.   Relevant past medical, surgical, family and social history reviewed and updated as indicated. Interim medical history since our last visit reviewed. Allergies and medications reviewed and updated.  Review of Systems  Constitutional: Negative for chills and fever.  Eyes: Negative for visual disturbance.  Respiratory: Negative for chest tightness and shortness of breath.   Cardiovascular: Negative for chest pain and leg swelling.  Musculoskeletal: Negative for back pain and gait problem.  Skin: Negative for rash.  Neurological: Negative for light-headedness and headaches.  Psychiatric/Behavioral: Negative for agitation and behavioral problems.  All other systems reviewed and are negative.   Per HPI unless specifically indicated above   Allergies as of 10/31/2019      Reactions    Azithromycin [name]   After a few days   Codeine [name]   Penicillins [name]   Has patient had a PCN reaction causing immediate rash, facial/tongue/throat swelling, SOB or lightheadedness with hypotension: NO Has patient had a PCN reaction causing severe rash involving mucus membranes or skin necrosis: No Has patient had a PCN reaction that required hospitalization: No Has patient had a PCN reaction occurring within the last 10 years: No If all of the above answers are "NO", then may proceed with Cephalosporin use.   Amlodipine Swelling   Pedal edema   Tetracyclines & Related Nausea And Vomiting   GI distress   Tramadol Itching   Side effect      Medication List       Accurate as of October 31, 2019 11:45 AM. If you have any questions, ask your nurse or doctor.        STOP taking these medications   Black Cohosh 540 MG Caps Stopped by: Fransisca Kaufmann Ousmane Seeman, MD     TAKE these medications   atorvastatin 20 MG tablet Commonly known as: LIPITOR Take 1 tablet (20 mg total) by mouth daily at 6 PM.   cyclobenzaprine 5 MG tablet Commonly known as: FLEXERIL Take 1 tablet (5 mg total) by mouth 3 (three) times daily as needed for muscle spasms.   fenofibrate 160 MG tablet Take 1 tablet (160 mg total) by mouth daily.   fexofenadine 180 MG tablet Commonly known as: ALLEGRA Take 180 mg by mouth daily as needed for allergies.   lisinopril 10 MG tablet Commonly known as: ZESTRIL Take 1 tablet (10 mg total) by mouth  daily.   melatonin 3 MG Tabs tablet Take 3 mg by mouth at bedtime.   omeprazole 20 MG capsule Commonly known as: PRILOSEC Take 1 capsule (20 mg total) by mouth daily.   PROBIOTIC ACIDOPHILUS PO Take 1 capsule by mouth daily.        Objective:   BP 119/82   Pulse 79   Temp 97.6 F (36.4 C)   Ht '5\' 3"'  (1.6 m)   Wt 160 lb (72.6 kg)   SpO2 99%   BMI 28.34 kg/m   Wt Readings from Last 3 Encounters:  10/31/19 160 lb (72.6 kg)  03/23/19 159 lb 3.2 oz (72.2 kg)    12/15/18 161 lb 12.8 oz (73.4 kg)    Physical Exam Vitals and nursing note reviewed.  Constitutional:      General: She is not in acute distress.    Appearance: She is well-developed. She is not diaphoretic.  Eyes:     Conjunctiva/sclera: Conjunctivae normal.  Cardiovascular:     Rate and Rhythm: Normal rate and regular rhythm.     Heart sounds: Normal heart sounds. No murmur heard.   Pulmonary:     Effort: Pulmonary effort is normal. No respiratory distress.     Breath sounds: Normal breath sounds. No wheezing.  Musculoskeletal:        General: No tenderness. Normal range of motion.  Skin:    General: Skin is warm and dry.     Findings: No rash.  Neurological:     Mental Status: She is alert and oriented to person, place, and time.     Coordination: Coordination normal.  Psychiatric:        Behavior: Behavior normal.       Assessment & Plan:   Problem List Items Addressed This Visit      Cardiovascular and Mediastinum   Essential hypertension   Relevant Medications   atorvastatin (LIPITOR) 20 MG tablet   fenofibrate 160 MG tablet   lisinopril (ZESTRIL) 10 MG tablet   Other Relevant Orders   CBC with Differential/Platelet (Completed)   CMP14+EGFR (Completed)     Digestive   GERD (gastroesophageal reflux disease)   Relevant Medications   omeprazole (PRILOSEC) 20 MG capsule   Other Relevant Orders   CBC with Differential/Platelet (Completed)     Other   Mixed hyperlipidemia - Primary   Relevant Medications   atorvastatin (LIPITOR) 20 MG tablet   fenofibrate 160 MG tablet   lisinopril (ZESTRIL) 10 MG tablet   Other Relevant Orders   CBC with Differential/Platelet (Completed)   CMP14+EGFR (Completed)   Lipid panel (Completed)      Patient was given sample of Ozempic to help with weight loss, this is to equivalent that [facility]  Will check blood work and continue other medicine, no other changes. Follow up plan: Return in about 6 months (around  05/01/2020), or if symptoms worsen or fail to improve, for Physical exam and chronic recheck.  Counseling provided for all of the vaccine components Orders Placed This Encounter  Procedures  . CBC with Differential/Platelet  . CMP14+EGFR  . Lipid panel    Caryl Pina, MD [facility] 10/31/2019, 11:45 AM

## 2019-11-01 LAB — CBC WITH DIFFERENTIAL/PLATELET
Basophils Absolute: 0.1 10*3/uL (ref 0.0–0.2)
Basos: 1 %
EOS (ABSOLUTE): 0.4 10*3/uL (ref 0.0–0.4)
Eos: 7 %
Hematocrit: 43.1 % (ref 34.0–46.6)
Hemoglobin: 14.9 g/dL (ref 11.1–15.9)
Immature Grans (Abs): 0 10*3/uL (ref 0.0–0.1)
Immature Granulocytes: 1 %
Lymphocytes Absolute: 2.6 10*3/uL (ref 0.7–3.1)
Lymphs: 41 %
MCH: 31.3 pg (ref 26.6–33.0)
MCHC: 34.6 g/dL (ref 31.5–35.7)
MCV: 91 fL (ref 79–97)
Monocytes Absolute: 0.5 10*3/uL (ref 0.1–0.9)
Monocytes: 8 %
Neutrophils Absolute: 2.7 10*3/uL (ref 1.4–7.0)
Neutrophils: 42 %
Platelets: 284 10*3/uL (ref 150–450)
RBC: 4.76 x10E6/uL (ref 3.77–5.28)
RDW: 12.5 % (ref 11.7–15.4)
WBC: 6.3 10*3/uL (ref 3.4–10.8)

## 2019-11-01 LAB — LIPID PANEL
Chol/HDL Ratio: 3.8 ratio (ref 0.0–4.4)
Cholesterol, Total: 219 mg/dL — ABNORMAL HIGH (ref 100–199)
HDL: 57 mg/dL (ref >39)
LDL Chol Calc (NIH): 133 mg/dL — ABNORMAL HIGH (ref 0–99)
Triglycerides: 166 mg/dL — ABNORMAL HIGH (ref 0–149)
VLDL Cholesterol Cal: 29 mg/dL (ref 5–40)

## 2019-11-01 LAB — CMP14+EGFR
ALT: 27 IU/L (ref 0–32)
AST: 29 IU/L (ref 0–40)
Albumin/Globulin Ratio: 2.4 — ABNORMAL HIGH (ref 1.2–2.2)
Albumin: 5 g/dL — ABNORMAL HIGH (ref 3.8–4.9)
Alkaline Phosphatase: 62 IU/L (ref 48–121)
BUN/Creatinine Ratio: 16 (ref 9–23)
BUN: 12 mg/dL (ref 6–24)
Bilirubin Total: 0.5 mg/dL (ref 0.0–1.2)
CO2: 24 mmol/L (ref 20–29)
Calcium: 10.4 mg/dL — ABNORMAL HIGH (ref 8.7–10.2)
Chloride: 101 mmol/L (ref 96–106)
Creatinine, Ser: 0.74 mg/dL (ref 0.57–1.00)
GFR calc Af Amer: 108 mL/min/{1.73_m2} (ref >59)
GFR calc non Af Amer: 93 mL/min/{1.73_m2} (ref >59)
Globulin, Total: 2.1 g/dL (ref 1.5–4.5)
Glucose: 91 mg/dL (ref 65–99)
Potassium: 4.3 mmol/L (ref 3.5–5.2)
Sodium: 140 mmol/L (ref 134–144)
Total Protein: 7.1 g/dL (ref 6.0–8.5)

## 2019-11-29 ENCOUNTER — Other Ambulatory Visit: Payer: Self-pay | Admitting: Family Medicine

## 2019-11-29 DIAGNOSIS — Z1231 Encounter for screening mammogram for malignant neoplasm of breast: Secondary | ICD-10-CM

## 2020-04-09 ENCOUNTER — Encounter: Payer: Self-pay | Admitting: Family Medicine

## 2020-04-09 ENCOUNTER — Ambulatory Visit (INDEPENDENT_AMBULATORY_CARE_PROVIDER_SITE_OTHER): Payer: 59 | Admitting: Family Medicine

## 2020-04-09 ENCOUNTER — Other Ambulatory Visit: Payer: Self-pay

## 2020-04-09 VITALS — BP 123/88 | HR 75 | Ht 63.0 in | Wt 158.0 lb

## 2020-04-09 DIAGNOSIS — Z01419 Encounter for gynecological examination (general) (routine) without abnormal findings: Secondary | ICD-10-CM

## 2020-04-09 DIAGNOSIS — K219 Gastro-esophageal reflux disease without esophagitis: Secondary | ICD-10-CM | POA: Diagnosis not present

## 2020-04-09 DIAGNOSIS — Z01411 Encounter for gynecological examination (general) (routine) with abnormal findings: Secondary | ICD-10-CM

## 2020-04-09 DIAGNOSIS — E782 Mixed hyperlipidemia: Secondary | ICD-10-CM

## 2020-04-09 DIAGNOSIS — I10 Essential (primary) hypertension: Secondary | ICD-10-CM

## 2020-04-09 MED ORDER — CYCLOBENZAPRINE HCL 5 MG PO TABS: 5.0000 mg | ORAL_TABLET | Freq: Three times a day (TID) | ORAL | 3 refills | Status: DC | PRN

## 2020-04-09 MED ORDER — OMEPRAZOLE 20 MG PO CPDR: 20.0000 mg | DELAYED_RELEASE_CAPSULE | Freq: Every day | ORAL | 3 refills | Status: DC

## 2020-04-09 MED ORDER — FENOFIBRATE 160 MG PO TABS: 160.0000 mg | ORAL_TABLET | Freq: Every day | ORAL | 3 refills | Status: DC

## 2020-04-09 MED ORDER — BENZONATATE 100 MG PO CAPS: 100.0000 mg | ORAL_CAPSULE | Freq: Two times a day (BID) | ORAL | 0 refills | Status: DC | PRN

## 2020-04-09 MED ORDER — ATORVASTATIN CALCIUM 20 MG PO TABS: 20.0000 mg | ORAL_TABLET | Freq: Every day | ORAL | 3 refills | Status: DC

## 2020-04-09 MED ORDER — LISINOPRIL 10 MG PO TABS: 10.0000 mg | ORAL_TABLET | Freq: Every day | ORAL | 3 refills | Status: DC

## 2020-04-09 NOTE — Addendum Note (Signed)
Addended by: Dorene Sorrow on: 04/09/2020 01:47 PM   Modules accepted: Orders

## 2020-04-09 NOTE — Progress Notes (Signed)
BP 123/88   Pulse 75   Ht '5\' 3"'  (1.6 m)   Wt 158 lb (71.7 kg)   SpO2 98%   BMI 27.99 kg/m    Subjective:   Patient ID: Alexis [name], female    DOB: 12-31-1965, 54 y.o.   MRN: [id number]  HPI: Alexis [name] is a 54 y.o. female presenting on 04/09/2020 for Medical Management of Chronic Issues (CPE)   HPI Adult well exam Patient is coming in for well woman physical.  She denies any major health issues.  She did just recently get over a cold and still has a small cough that she denies anything major. Patient denies any chest pain, shortness of breath, headaches or vision issues, abdominal complaints, diarrhea, nausea, vomiting, or joint issues.   Hypertension Patient is currently on lisinopril, and their blood pressure today is 123/88. Patient denies any lightheadedness or dizziness. Patient denies headaches, blurred vision, chest pains, shortness of breath, or weakness. Denies any side effects from medication and is content with current medication.   Hyperlipidemia Patient is coming in for recheck of his hyperlipidemia. The patient is currently taking atorvastatin. They deny any issues with myalgias or history of liver damage from it. They deny any focal numbness or weakness or chest pain.   GERD Patient is currently on omeprazole.  She denies any major symptoms or abdominal pain or belching or burping. She denies any blood in her stool or lightheadedness or dizziness.   Relevant past medical, surgical, family and social history reviewed and updated as indicated. Interim medical history since our last visit reviewed. Allergies and medications reviewed and updated.  Review of Systems  Constitutional: Negative for chills and fever.  HENT: Negative for ear pain and tinnitus.   Eyes: Negative for pain.  Respiratory: Positive for cough. Negative for shortness of breath and wheezing.   Cardiovascular: Negative for chest pain, palpitations and leg swelling.  Gastrointestinal:  Negative for abdominal pain, blood in stool, constipation and diarrhea.  Genitourinary: Negative for dysuria and hematuria.  Musculoskeletal: Negative for back pain and myalgias.  Skin: Negative for rash.  Neurological: Negative for dizziness, weakness and headaches.  Psychiatric/Behavioral: Negative for suicidal ideas.    Per HPI unless specifically indicated above   Allergies as of 04/09/2020      Reactions   Azithromycin [name]   After a few days   Codeine [name]   Penicillins [name]   Has patient had a PCN reaction causing immediate rash, facial/tongue/throat swelling, SOB or lightheadedness with hypotension: NO Has patient had a PCN reaction causing severe rash involving mucus membranes or skin necrosis: No Has patient had a PCN reaction that required hospitalization: No Has patient had a PCN reaction occurring within the last 10 years: No If all of the above answers are "NO", then may proceed with Cephalosporin use.   Amlodipine Swelling   Pedal edema   Tetracyclines & Related Nausea And Vomiting   GI distress   Tramadol Itching   Side effect      Medication List       Accurate as of April 09, 2020 10:23 AM. If you have any questions, ask your nurse or doctor.        STOP taking these medications   PROBIOTIC ACIDOPHILUS PO Stopped by: Fransisca Kaufmann Terissa Haffey, MD     TAKE these medications   atorvastatin 20 MG tablet Commonly known as: LIPITOR Take 1 tablet (20 mg total) by mouth daily at 6 PM.   cyclobenzaprine 5 MG  tablet Commonly known as: FLEXERIL Take 1 tablet (5 mg total) by mouth 3 (three) times daily as needed for muscle spasms.   fenofibrate 160 MG tablet Take 1 tablet (160 mg total) by mouth daily.   fexofenadine 180 MG tablet Commonly known as: ALLEGRA Take 180 mg by mouth daily as needed for allergies.   lisinopril 10 MG tablet Commonly known as: ZESTRIL Take 1 tablet (10 mg total) by mouth daily.   melatonin 3 MG Tabs tablet Take 3 mg by mouth  at bedtime.   omeprazole 20 MG capsule Commonly known as: PRILOSEC Take 1 capsule (20 mg total) by mouth daily.        Objective:   Pulse 75   Ht '5\' 3"'  (1.6 m)   Wt 158 lb (71.7 kg)   SpO2 98%   BMI 27.99 kg/m   Wt Readings from Last 3 Encounters:  04/09/20 158 lb (71.7 kg)  10/31/19 160 lb (72.6 kg)  03/23/19 159 lb 3.2 oz (72.2 kg)    Physical Exam Vitals and nursing note reviewed.  Constitutional:      General: She is not in acute distress.    Appearance: She is well-developed. She is not diaphoretic.  HENT:     Mouth/Throat:     Mouth: Mucous membranes are moist.     Pharynx: Oropharynx is clear. No oropharyngeal exudate or posterior oropharyngeal erythema.  Eyes:     Conjunctiva/sclera: Conjunctivae normal.  Neck:     Thyroid: No thyromegaly.  Cardiovascular:     Rate and Rhythm: Normal rate and regular rhythm.     Heart sounds: Normal heart sounds. No murmur heard.   Pulmonary:     Effort: Pulmonary effort is normal. No respiratory distress.     Breath sounds: Normal breath sounds. No wheezing.  Chest:     Breasts: Breasts are symmetrical.        Right: No inverted nipple, mass, nipple discharge, skin change or tenderness.        Left: No inverted nipple, mass, nipple discharge, skin change or tenderness.  Abdominal:     General: Bowel sounds are normal. There is no distension.     Palpations: Abdomen is soft.     Tenderness: There is no abdominal tenderness. There is no guarding or rebound.  Genitourinary:    Exam position: Supine.     Labia:        Right: No rash or lesion.        Left: No rash or lesion.      Vagina: Normal.     Cervix: No cervical motion tenderness, discharge or friability.     Uterus: Not deviated, not enlarged, not fixed and not tender.      Adnexa:        Right: No mass or tenderness.         Left: No mass or tenderness.    Musculoskeletal:        General: No tenderness. Normal range of motion.     Cervical back: Neck  supple.  Lymphadenopathy:     Cervical: No cervical adenopathy.  Skin:    General: Skin is warm and dry.     Findings: No rash.  Neurological:     Mental Status: She is alert and oriented to person, place, and time.     Coordination: Coordination normal.  Psychiatric:        Behavior: Behavior normal.       Assessment & Plan:   Problem List  Items Addressed This Visit      Cardiovascular and Mediastinum   Essential hypertension   Relevant Medications   atorvastatin (LIPITOR) 20 MG tablet   fenofibrate 160 MG tablet   lisinopril (ZESTRIL) 10 MG tablet   Other Relevant Orders   CMP14+EGFR     Digestive   GERD (gastroesophageal reflux disease)   Relevant Medications   omeprazole (PRILOSEC) 20 MG capsule   Other Relevant Orders   CBC with Differential/Platelet     Other   Mixed hyperlipidemia   Relevant Medications   atorvastatin (LIPITOR) 20 MG tablet   fenofibrate 160 MG tablet   lisinopril (ZESTRIL) 10 MG tablet   Other Relevant Orders   Lipid panel    Other Visit Diagnoses    Encounter for well woman exam with routine gynecological exam    -  Primary   Relevant Orders   CBC with Differential/Platelet   CMP14+EGFR   Lipid panel      Continue current medication, no changes, she has her mammogram scheduled already.  She will do blood work today.  Follow-up in 6 months Follow up plan: Return in about 6 months (around 10/08/2020), or if symptoms worsen or fail to improve, for htn and hld.  Counseling provided for all of the vaccine components No orders of the defined types were placed in this encounter.   Caryl Pina, MD [facility] 04/09/2020, 10:23 AM

## 2020-04-10 LAB — CMP14+EGFR
ALT: 20 IU/L (ref 0–32)
AST: 21 IU/L (ref 0–40)
Albumin/Globulin Ratio: 2.3 — ABNORMAL HIGH (ref 1.2–2.2)
Albumin: 4.6 g/dL (ref 3.8–4.9)
Alkaline Phosphatase: 64 IU/L (ref 44–121)
BUN/Creatinine Ratio: 14 (ref 9–23)
BUN: 11 mg/dL (ref 6–24)
Bilirubin Total: 0.4 mg/dL (ref 0.0–1.2)
CO2: 22 mmol/L (ref 20–29)
Calcium: 10 mg/dL (ref 8.7–10.2)
Chloride: 100 mmol/L (ref 96–106)
Creatinine, Ser: 0.77 mg/dL (ref 0.57–1.00)
GFR calc Af Amer: 103 mL/min/{1.73_m2} (ref >59)
GFR calc non Af Amer: 89 mL/min/{1.73_m2} (ref >59)
Globulin, Total: 2 g/dL (ref 1.5–4.5)
Glucose: 91 mg/dL (ref 65–99)
Potassium: 4 mmol/L (ref 3.5–5.2)
Sodium: 139 mmol/L (ref 134–144)
Total Protein: 6.6 g/dL (ref 6.0–8.5)

## 2020-04-10 LAB — CBC WITH DIFFERENTIAL/PLATELET
Basophils Absolute: 0.1 10*3/uL (ref 0.0–0.2)
Basos: 1 %
EOS (ABSOLUTE): 1 10*3/uL — ABNORMAL HIGH (ref 0.0–0.4)
Eos: 17 %
Hematocrit: 42.1 % (ref 34.0–46.6)
Hemoglobin: 14.3 g/dL (ref 11.1–15.9)
Immature Grans (Abs): 0 10*3/uL (ref 0.0–0.1)
Immature Granulocytes: 0 %
Lymphocytes Absolute: 2.2 10*3/uL (ref 0.7–3.1)
Lymphs: 38 %
MCH: 30 pg (ref 26.6–33.0)
MCHC: 34 g/dL (ref 31.5–35.7)
MCV: 88 fL (ref 79–97)
Monocytes Absolute: 0.5 10*3/uL (ref 0.1–0.9)
Monocytes: 8 %
Neutrophils Absolute: 2.1 10*3/uL (ref 1.4–7.0)
Neutrophils: 36 %
Platelets: 290 10*3/uL (ref 150–450)
RBC: 4.77 x10E6/uL (ref 3.77–5.28)
RDW: 11.7 % (ref 11.7–15.4)
WBC: 5.8 10*3/uL (ref 3.4–10.8)

## 2020-04-10 LAB — LIPID PANEL
Chol/HDL Ratio: 3.8 ratio (ref 0.0–4.4)
Cholesterol, Total: 205 mg/dL — ABNORMAL HIGH (ref 100–199)
HDL: 54 mg/dL (ref >39)
LDL Chol Calc (NIH): 120 mg/dL — ABNORMAL HIGH (ref 0–99)
Triglycerides: 176 mg/dL — ABNORMAL HIGH (ref 0–149)
VLDL Cholesterol Cal: 31 mg/dL (ref 5–40)

## 2020-04-30 ENCOUNTER — Other Ambulatory Visit: Payer: Self-pay

## 2020-04-30 ENCOUNTER — Ambulatory Visit
Admission: RE | Admit: 2020-04-30 | Discharge: 2020-04-30 | Disposition: A | Discharge status: Home / Self Care | Payer: 59 | Source: Ambulatory Visit | Attending: Family Medicine | Admitting: Family Medicine

## 2020-04-30 DIAGNOSIS — Z1231 Encounter for screening mammogram for malignant neoplasm of breast: Secondary | ICD-10-CM

## 2020-10-08 ENCOUNTER — Other Ambulatory Visit: Payer: Self-pay

## 2020-10-08 ENCOUNTER — Telehealth: Payer: Self-pay | Admitting: Family Medicine

## 2020-10-08 ENCOUNTER — Encounter: Payer: Self-pay | Admitting: Family Medicine

## 2020-10-08 ENCOUNTER — Ambulatory Visit (INDEPENDENT_AMBULATORY_CARE_PROVIDER_SITE_OTHER): Payer: 59 | Admitting: Family Medicine

## 2020-10-08 VITALS — BP 118/79 | HR 69 | Ht 63.0 in | Wt 161.0 lb

## 2020-10-08 DIAGNOSIS — E782 Mixed hyperlipidemia: Secondary | ICD-10-CM

## 2020-10-08 DIAGNOSIS — E663 Overweight: Secondary | ICD-10-CM

## 2020-10-08 DIAGNOSIS — I10 Essential (primary) hypertension: Secondary | ICD-10-CM | POA: Diagnosis not present

## 2020-10-08 DIAGNOSIS — K219 Gastro-esophageal reflux disease without esophagitis: Secondary | ICD-10-CM

## 2020-10-08 MED ORDER — OZEMPIC (0.25 OR 0.5 MG/DOSE) 2 MG/1.5ML ~~LOC~~ SOPN: 0.5000 mg | PEN_INJECTOR | SUBCUTANEOUS | 4 refills | Status: DC

## 2020-10-08 NOTE — Progress Notes (Signed)
BP 118/79   Pulse 69   Ht 5' 3" (1.6 m)   Wt 161 lb (73 kg)   SpO2 97%   BMI 28.52 kg/m    Subjective:   Patient ID: Alexis [name], female    DOB: Aug 16, 1965, 55 y.o.   MRN: [id number]  HPI: Alexis [name] is a 55 y.o. female presenting on 10/08/2020 for Medical Management of Chronic Issues, Hyperlipidemia, and Hypertension   HPI Hypertension Patient is currently on lisinopril, and their blood pressure today is 118/79. Patient denies any lightheadedness or dizziness. Patient denies headaches, blurred vision, chest pains, shortness of breath, or weakness. Denies any side effects from medication and is content with current medication.   Hyperlipidemia Patient is coming in for recheck of his hyperlipidemia. The patient is currently taking atorvastatin and fenofibrate. They deny any issues with myalgias or history of liver damage from it. They deny any focal numbness or weakness or chest pain.   GERD Patient is currently on omeprazole.  She denies any major symptoms or abdominal pain or belching or burping. She denies any blood in her stool or lightheadedness or dizziness.   Patient is overweight and feels that she has been gaining weight and would like to do weight loss medication like she has done previously.  She has a BMI of 28 with hypertension and hyperlipidemia as risk factors.  Relevant past medical, surgical, family and social history reviewed and updated as indicated. Interim medical history since our last visit reviewed. Allergies and medications reviewed and updated.  Review of Systems  Constitutional: Negative for chills and fever.  HENT: Negative for congestion, ear discharge and ear pain.   Eyes: Negative for redness and visual disturbance.  Respiratory: Negative for chest tightness and shortness of breath.   Cardiovascular: Negative for chest pain and leg swelling.  Genitourinary: Negative for difficulty urinating and dysuria.  Musculoskeletal: Negative for back  pain and gait problem.  Skin: Negative for rash.  Neurological: Negative for dizziness, light-headedness and headaches.  Psychiatric/Behavioral: Negative for agitation and behavioral problems.  All other systems reviewed and are negative.   Per HPI unless specifically indicated above   Allergies as of 10/08/2020      Reactions   Azithromycin [name]   After a few days   Codeine [name]   Penicillins [name]   Has patient had a PCN reaction causing immediate rash, facial/tongue/throat swelling, SOB or lightheadedness with hypotension: NO Has patient had a PCN reaction causing severe rash involving mucus membranes or skin necrosis: No Has patient had a PCN reaction that required hospitalization: No Has patient had a PCN reaction occurring within the last 10 years: No If all of the above answers are "NO", then may proceed with Cephalosporin use.   Amlodipine Swelling   Pedal edema   Tetracyclines & Related Nausea And Vomiting   GI distress   Tramadol Itching   Side effect      Medication List       Accurate as of October 08, 2020  8:27 AM. If you have any questions, ask your nurse or doctor.        atorvastatin 20 MG tablet Commonly known as: LIPITOR Take 1 tablet (20 mg total) by mouth daily at 6 PM.   benzonatate 100 MG capsule Commonly known as: TESSALON Take 1 capsule (100 mg total) by mouth 2 (two) times daily as needed for cough.   cyclobenzaprine 5 MG tablet Commonly known as: FLEXERIL Take 1 tablet (5 mg total) by mouth  3 (three) times daily as needed for muscle spasms.   fenofibrate 160 MG tablet Take 1 tablet (160 mg total) by mouth daily.   fexofenadine 180 MG tablet Commonly known as: ALLEGRA Take 180 mg by mouth daily as needed for allergies.   lisinopril 10 MG tablet Commonly known as: ZESTRIL Take 1 tablet (10 mg total) by mouth daily.   melatonin 3 MG Tabs tablet Take 3 mg by mouth at bedtime.   omeprazole 20 MG capsule Commonly known as: PRILOSEC Take  1 capsule (20 mg total) by mouth daily.        Objective:   BP 118/79   Pulse 69   Ht 5' 3" (1.6 m)   Wt 161 lb (73 kg)   SpO2 97%   BMI 28.52 kg/m   Wt Readings from Last 3 Encounters:  10/08/20 161 lb (73 kg)  04/09/20 158 lb (71.7 kg)  10/31/19 160 lb (72.6 kg)    Physical Exam Vitals and nursing note reviewed.  Constitutional:      General: She is not in acute distress.    Appearance: She is well-developed. She is not diaphoretic.  Eyes:     Conjunctiva/sclera: Conjunctivae normal.  Cardiovascular:     Rate and Rhythm: Normal rate and regular rhythm.     Heart sounds: Normal heart sounds. No murmur heard.   Pulmonary:     Effort: Pulmonary effort is normal. No respiratory distress.     Breath sounds: Normal breath sounds. No wheezing.  Musculoskeletal:        General: No tenderness. Normal range of motion.  Skin:    General: Skin is warm and dry.     Findings: No rash.  Neurological:     Mental Status: She is alert and oriented to person, place, and time.     Coordination: Coordination normal.  Psychiatric:        Behavior: Behavior normal.       Assessment & Plan:   Problem List Items Addressed This Visit      Cardiovascular and Mediastinum   Essential hypertension   Relevant Medications   Semaglutide,0.25 or 0.5MG/DOS, (OZEMPIC, 0.25 OR 0.5 MG/DOSE,) 2 MG/1.5ML SOPN   Other Relevant Orders   CBC with Differential/Platelet   CMP14+EGFR   Lipid panel     Digestive   GERD (gastroesophageal reflux disease)   Relevant Orders   CBC with Differential/Platelet     Other   Mixed hyperlipidemia - Primary   Relevant Medications   Semaglutide,0.25 or 0.5MG/DOS, (OZEMPIC, 0.25 OR 0.5 MG/DOSE,) 2 MG/1.5ML SOPN   Other Relevant Orders   CMP14+EGFR   Lipid panel   Overweight (BMI 25.0-29.9)   Relevant Medications   Semaglutide,0.25 or 0.5MG/DOS, (OZEMPIC, 0.25 OR 0.5 MG/DOSE,) 2 MG/1.5ML SOPN      Patient wants to try the Ozempic for weight loss,  will send prescription for, she was given samples before and it did help. Follow up plan: Return in about 6 months (around 04/09/2021), or if symptoms worsen or fail to improve, for Well woman exam physical.  Counseling provided for all of the vaccine components No orders of the defined types were placed in this encounter.   Caryl Pina, MD [facility] 10/08/2020, 8:27 AM

## 2020-10-08 NOTE — Telephone Encounter (Signed)
PA started and sent to plan Your information has been sent to [facility].

## 2020-10-08 NOTE — Telephone Encounter (Signed)
Ozempic denied  Pt is not diabetic, does not have a contraindication to Metformin.  If needed peer to peer call [phone]  Dr. Louanne Skye,  Is there something else pt can try?

## 2020-10-09 ENCOUNTER — Other Ambulatory Visit: Payer: Self-pay | Admitting: Family Medicine

## 2020-10-09 DIAGNOSIS — E663 Overweight: Secondary | ICD-10-CM

## 2020-10-09 LAB — CMP14+EGFR
ALT: 23 IU/L (ref 0–32)
AST: 22 IU/L (ref 0–40)
Albumin/Globulin Ratio: 2.9 — ABNORMAL HIGH (ref 1.2–2.2)
Albumin: 5 g/dL — ABNORMAL HIGH (ref 3.8–4.9)
Alkaline Phosphatase: 49 IU/L (ref 44–121)
BUN/Creatinine Ratio: 12 (ref 9–23)
BUN: 9 mg/dL (ref 6–24)
Bilirubin Total: 0.3 mg/dL (ref 0.0–1.2)
CO2: 25 mmol/L (ref 20–29)
Calcium: 10.1 mg/dL (ref 8.7–10.2)
Chloride: 105 mmol/L (ref 96–106)
Creatinine, Ser: 0.77 mg/dL (ref 0.57–1.00)
Globulin, Total: 1.7 g/dL (ref 1.5–4.5)
Glucose: 100 mg/dL — ABNORMAL HIGH (ref 65–99)
Potassium: 5 mmol/L (ref 3.5–5.2)
Sodium: 141 mmol/L (ref 134–144)
Total Protein: 6.7 g/dL (ref 6.0–8.5)
eGFR: 92 mL/min/{1.73_m2} (ref >59)

## 2020-10-09 LAB — CBC WITH DIFFERENTIAL/PLATELET
Basophils Absolute: 0 10*3/uL (ref 0.0–0.2)
Basos: 1 %
EOS (ABSOLUTE): 0.2 10*3/uL (ref 0.0–0.4)
Eos: 4 %
Hematocrit: 39.5 % (ref 34.0–46.6)
Hemoglobin: 13.3 g/dL (ref 11.1–15.9)
Immature Grans (Abs): 0 10*3/uL (ref 0.0–0.1)
Immature Granulocytes: 0 %
Lymphocytes Absolute: 1.8 10*3/uL (ref 0.7–3.1)
Lymphs: 42 %
MCH: 30.7 pg (ref 26.6–33.0)
MCHC: 33.7 g/dL (ref 31.5–35.7)
MCV: 91 fL (ref 79–97)
Monocytes Absolute: 0.3 10*3/uL (ref 0.1–0.9)
Monocytes: 7 %
Neutrophils Absolute: 2 10*3/uL (ref 1.4–7.0)
Neutrophils: 46 %
Platelets: 309 10*3/uL (ref 150–450)
RBC: 4.33 x10E6/uL (ref 3.77–5.28)
RDW: 12.2 % (ref 11.7–15.4)
WBC: 4.3 10*3/uL (ref 3.4–10.8)

## 2020-10-09 LAB — LIPID PANEL
Chol/HDL Ratio: 4 ratio (ref 0.0–4.4)
Cholesterol, Total: 215 mg/dL — ABNORMAL HIGH (ref 100–199)
HDL: 54 mg/dL (ref >39)
LDL Chol Calc (NIH): 128 mg/dL — ABNORMAL HIGH (ref 0–99)
Triglycerides: 186 mg/dL — ABNORMAL HIGH (ref 0–149)
VLDL Cholesterol Cal: 33 mg/dL (ref 5–40)

## 2020-10-09 MED ORDER — SAXENDA 18 MG/3ML ~~LOC~~ SOPN: PEN_INJECTOR | SUBCUTANEOUS | 0 refills | Status: DC

## 2020-10-09 MED ORDER — SAXENDA 18 MG/3ML ~~LOC~~ SOPN: 3.0000 mg | PEN_INJECTOR | Freq: Every day | SUBCUTANEOUS | 5 refills | Status: DC

## 2020-10-09 NOTE — Progress Notes (Unsigned)
Please let her know that I sent Saxenda for her because it looks like the Ozempic was not covered, we will have to see if it will be covered.

## 2020-10-20 ENCOUNTER — Telehealth: Payer: Self-pay | Admitting: *Deleted

## 2020-10-20 DIAGNOSIS — E663 Overweight: Secondary | ICD-10-CM

## 2020-10-20 NOTE — Telephone Encounter (Signed)
PA in process (Key: [id number]) Rx #: [id number] Saxenda 18MG pen-injectors

## 2020-10-21 NOTE — Telephone Encounter (Signed)
PA for saxenda denied

## 2020-10-27 MED ORDER — OZEMPIC (0.25 OR 0.5 MG/DOSE) 2 MG/1.5ML ~~LOC~~ SOPN: 0.5000 mg | PEN_INJECTOR | SUBCUTANEOUS | 1 refills | Status: DC

## 2020-10-27 NOTE — Telephone Encounter (Signed)
Please let the patient know that Alexis [name] was denied by her insurance, she wants to we can try for the other 1 called Ozempic and see if it was covered?  Let me know

## 2020-10-27 NOTE — Telephone Encounter (Signed)
Sent ozempic to pharmacy

## 2020-10-27 NOTE — Telephone Encounter (Signed)
Patient would like for you to send in the prescription for Ozempic and see if her insurance company will cover that.

## 2020-11-01 ENCOUNTER — Other Ambulatory Visit: Payer: Self-pay | Admitting: Family Medicine

## 2020-11-01 DIAGNOSIS — I10 Essential (primary) hypertension: Secondary | ICD-10-CM

## 2020-11-01 DIAGNOSIS — E782 Mixed hyperlipidemia: Secondary | ICD-10-CM

## 2020-11-01 DIAGNOSIS — K219 Gastro-esophageal reflux disease without esophagitis: Secondary | ICD-10-CM

## 2020-12-16 ENCOUNTER — Telehealth: Payer: Self-pay | Admitting: Family Medicine

## 2020-12-16 DIAGNOSIS — E663 Overweight: Secondary | ICD-10-CM

## 2020-12-16 DIAGNOSIS — E782 Mixed hyperlipidemia: Secondary | ICD-10-CM

## 2020-12-16 MED ORDER — TIRZEPATIDE 2.5 MG/0.5ML ~~LOC~~ SOAJ
2.5000 mg | SUBCUTANEOUS | 1 refills | Status: DC
Start: 2020-12-16 — End: 2021-02-05

## 2020-12-16 NOTE — Telephone Encounter (Signed)
Please let the patient know that I sent in [facility], which reports more weight loss than the other 2 but is also more expensive, unfortunately Ozempic is out of stock right now, the other option is we could switch her to Victoza in the future which is a daily injectable but lets try this 1 first

## 2020-12-16 NOTE — Telephone Encounter (Signed)
Patient aware and also informed her of coupon online to get medication for $25.

## 2020-12-16 NOTE — Telephone Encounter (Signed)
Patient reports you had prescribed Ozempic but her insurance company would not pay for it so she has been using Trulicity instead for the last three weeks for weight loss.  She has been walking almost daily and is eating healthier meals but she has not lost any weight.  She also has noticed she has been constipated since starting the med and is unable to have a bowel movement without using softeners or a laxative.  Patient would like to try another injectable instead of continuing the Trulicity.  Please advise.

## 2021-02-05 ENCOUNTER — Other Ambulatory Visit: Payer: Self-pay | Admitting: Family Medicine

## 2021-02-05 DIAGNOSIS — E782 Mixed hyperlipidemia: Secondary | ICD-10-CM

## 2021-02-05 DIAGNOSIS — E663 Overweight: Secondary | ICD-10-CM

## 2021-02-05 NOTE — Telephone Encounter (Signed)
PATIENT AWARE

## 2021-02-05 NOTE — Telephone Encounter (Signed)
Sent higher milligram dose to 5 mg, she can try it let me know how her stomach does.

## 2021-02-18 ENCOUNTER — Other Ambulatory Visit: Payer: Self-pay | Admitting: Family Medicine

## 2021-04-13 ENCOUNTER — Ambulatory Visit (INDEPENDENT_AMBULATORY_CARE_PROVIDER_SITE_OTHER): Payer: 59 | Admitting: Family Medicine

## 2021-04-13 ENCOUNTER — Encounter: Payer: Self-pay | Admitting: Family Medicine

## 2021-04-13 ENCOUNTER — Other Ambulatory Visit (HOSPITAL_COMMUNITY)
Admission: RE | Admit: 2021-04-13 | Discharge: 2021-04-13 | Disposition: A | Discharge status: Home / Self Care | Payer: 59 | Source: Ambulatory Visit | Attending: Family Medicine | Admitting: Family Medicine

## 2021-04-13 VITALS — BP 117/85 | HR 76 | Temp 98.7°F | Ht 63.0 in | Wt 149.0 lb

## 2021-04-13 DIAGNOSIS — K219 Gastro-esophageal reflux disease without esophagitis: Secondary | ICD-10-CM

## 2021-04-13 DIAGNOSIS — Z01419 Encounter for gynecological examination (general) (routine) without abnormal findings: Secondary | ICD-10-CM | POA: Insufficient documentation

## 2021-04-13 DIAGNOSIS — E782 Mixed hyperlipidemia: Secondary | ICD-10-CM | POA: Diagnosis not present

## 2021-04-13 DIAGNOSIS — G4700 Insomnia, unspecified: Secondary | ICD-10-CM

## 2021-04-13 DIAGNOSIS — I10 Essential (primary) hypertension: Secondary | ICD-10-CM

## 2021-04-13 DIAGNOSIS — E663 Overweight: Secondary | ICD-10-CM

## 2021-04-13 DIAGNOSIS — Z01411 Encounter for gynecological examination (general) (routine) with abnormal findings: Secondary | ICD-10-CM

## 2021-04-13 MED ORDER — TRAZODONE HCL 50 MG PO TABS: 25.0000 mg | ORAL_TABLET | Freq: Every evening | ORAL | 3 refills | Status: DC | PRN

## 2021-04-13 MED ORDER — ATORVASTATIN CALCIUM 20 MG PO TABS
20.0000 mg | ORAL_TABLET | Freq: Every day | ORAL | 3 refills | Status: DC
Start: 2021-04-13 — End: 2022-04-07

## 2021-04-13 MED ORDER — TIRZEPATIDE 5 MG/0.5ML ~~LOC~~ SOAJ: 5.0000 mg | SUBCUTANEOUS | 1 refills | Status: DC

## 2021-04-13 MED ORDER — TIRZEPATIDE 7.5 MG/0.5ML ~~LOC~~ SOAJ: 7.5000 mg | SUBCUTANEOUS | 1 refills | Status: DC

## 2021-04-13 MED ORDER — OMEPRAZOLE 20 MG PO CPDR
20.0000 mg | DELAYED_RELEASE_CAPSULE | Freq: Every day | ORAL | 3 refills | Status: DC
Start: 2021-04-13 — End: 2021-05-05

## 2021-04-13 MED ORDER — FENOFIBRATE 160 MG PO TABS: 160.0000 mg | ORAL_TABLET | Freq: Every day | ORAL | 3 refills | Status: DC

## 2021-04-13 MED ORDER — LISINOPRIL 10 MG PO TABS: 10.0000 mg | ORAL_TABLET | Freq: Every day | ORAL | 3 refills | Status: DC

## 2021-04-13 NOTE — Progress Notes (Signed)
BP 117/85   Pulse 76   Temp 98.7 F (37.1 C) (Temporal)   Ht 5\' 3"  (1.6 m)   Wt 149 lb (67.6 kg)   SpO2 100%   BMI 26.39 kg/m    Subjective:   Patient ID: , female    DOB: May 26, 1965, 55 y.o.   MRN: [id number]  HPI: Illiana [name] is a 55 y.o. female presenting on 04/13/2021 for Medical Management of Chronic Issues and Annual Exam   HPI Adult well exam and physical and well woman Patient denies any chest pain, shortness of breath, headaches or vision issues, abdominal complaints, diarrhea, nausea, vomiting, or joint issues.  Patient takes [facility] for weight loss and being overweight.  She has been working really well for her.  She is down quite a few pounds since last time.  Her BMI now is 26 which is a great improvement.  Patient is postmenopausal and has not had cycles in over 2 years almost.  Hypertension Patient is currently on lisinopril, and their blood pressure today is 117/85. Patient denies any lightheadedness or dizziness. Patient denies headaches, blurred vision, chest pains, shortness of breath, or weakness. Denies any side effects from medication and is content with current medication.   Hyperlipidemia Patient is coming in for recheck of his hyperlipidemia. The patient is currently taking fenofibrate and Lipitor. They deny any issues with myalgias or history of liver damage from it. They deny any focal numbness or weakness or chest pain.   GERD Patient is currently on omeprazole.  She denies any major symptoms or abdominal pain or belching or burping. She denies any blood in her stool or lightheadedness or dizziness.   Relevant past medical, surgical, family and social history reviewed and updated as indicated. Interim medical history since our last visit reviewed. Allergies and medications reviewed and updated.  Review of Systems  Constitutional:  Negative for chills and fever.  HENT:  Negative for congestion, ear discharge, ear pain and tinnitus.    Eyes:  Negative for pain and visual disturbance.  Respiratory:  Negative for cough, chest tightness, shortness of breath and wheezing.   Cardiovascular:  Negative for chest pain, palpitations and leg swelling.  Gastrointestinal:  Negative for abdominal pain, blood in stool, constipation and diarrhea.  Genitourinary:  Negative for difficulty urinating, dysuria and hematuria.  Musculoskeletal:  Negative for back pain, gait problem and myalgias.  Skin:  Negative for rash.  Neurological:  Negative for dizziness, weakness, light-headedness and headaches.  Psychiatric/Behavioral:  Negative for agitation, behavioral problems and suicidal ideas.   All other systems reviewed and are negative.  Per HPI unless specifically indicated above   Allergies as of 04/13/2021       Reactions   Azithromycin Hives   After a few days   Codeine Hives   Penicillins Hives   Has patient had a PCN reaction causing immediate rash, facial/tongue/throat swelling, SOB or lightheadedness with hypotension: NO Has patient had a PCN reaction causing severe rash involving mucus membranes or skin necrosis: No Has patient had a PCN reaction that required hospitalization: No Has patient had a PCN reaction occurring within the last 10 years: No If all of the above answers are "NO", then may proceed with Cephalosporin use.   Amlodipine Swelling   Pedal edema   Tetracyclines & Related Nausea And Vomiting   GI distress   Tramadol Itching   Side effect        Medication List        Accurate  as of April 13, 2021  8:45 AM. If you have any questions, ask your nurse or doctor.          STOP taking these medications    benzonatate 100 MG capsule Commonly known as: TESSALON Stopped by: Fransisca Kaufmann Kimara Bencomo, MD       TAKE these medications    atorvastatin 20 MG tablet Commonly known as: LIPITOR Take 1 tablet (20 mg total) by mouth at bedtime. What changed: See the new instructions. Changed by: Fransisca Kaufmann  Khaalid Lefkowitz, MD   cyclobenzaprine 5 MG tablet Commonly known as: FLEXERIL TAKE 1 TABLET THREE TIMES DAILY AS NEEDED FOR MUSCLE SPASM   fenofibrate 160 MG tablet Take 1 tablet (160 mg total) by mouth daily.   fexofenadine 180 MG tablet Commonly known as: ALLEGRA Take 180 mg by mouth daily as needed for allergies.   lisinopril 10 MG tablet Commonly known as: ZESTRIL Take 1 tablet (10 mg total) by mouth daily.   melatonin 3 MG Tabs tablet Take 3 mg by mouth at bedtime.   omeprazole 20 MG capsule Commonly known as: PRILOSEC Take 1 capsule (20 mg total) by mouth daily.   tirzepatide 5 MG/0.5ML Pen Commonly known as: MOUNJARO Inject 5 mg into the skin once a week. What changed: Another medication with the same name was added. Make sure you understand how and when to take each. Changed by: Fransisca Kaufmann Addie Cederberg, MD   tirzepatide 7.5 MG/0.5ML Pen Commonly known as: MOUNJARO Inject 7.5 mg into the skin once a week. What changed: You were already taking a medication with the same name, and this prescription was added. Make sure you understand how and when to take each. Changed by: Fransisca Kaufmann Jimya Ciani, MD         Objective:   BP 117/85   Pulse 76   Temp 98.7 F (37.1 C) (Temporal)   Ht _0  (1.6 m)   Wt 149 lb (67.6 kg)   SpO2 100%   BMI 26.39 kg/m   Wt Readings from Last 3 Encounters:  04/13/21 149 lb (67.6 kg)  10/08/20 161 lb (73 kg)  04/09/20 158 lb (71.7 kg)    Physical Exam Vitals reviewed.  Constitutional:      General: She is not in acute distress.    Appearance: She is well-developed. She is not diaphoretic.  HENT:     Mouth/Throat:     Mouth: Mucous membranes are moist.     Pharynx: No oropharyngeal exudate or posterior oropharyngeal erythema.  Eyes:     Conjunctiva/sclera: Conjunctivae normal.  Neck:     Thyroid: No thyromegaly.  Cardiovascular:     Rate and Rhythm: Normal rate and regular rhythm.     Heart sounds: Normal heart sounds. No murmur  heard. Pulmonary:     Effort: Pulmonary effort is normal. No respiratory distress.     Breath sounds: Normal breath sounds. No wheezing.  Chest:  Breasts:    Breasts are symmetrical.     Right: No inverted nipple, mass, nipple discharge, skin change or tenderness.     Left: No inverted nipple, mass, nipple discharge, skin change or tenderness.  Abdominal:     General: Bowel sounds are normal. There is no distension.     Palpations: Abdomen is soft.     Tenderness: There is no abdominal tenderness. There is no guarding or rebound.  Genitourinary:    Exam position: Supine.     Labia:        Right: No  rash or lesion.        Left: No rash or lesion.      Vagina: Normal.     Cervix: No cervical motion tenderness, discharge or friability.     Uterus: Not deviated, not enlarged, not fixed and not tender.      Adnexa:        Right: No mass or tenderness.         Left: No mass or tenderness.    Musculoskeletal:        General: No swelling. Normal range of motion.     Cervical back: Neck supple.  Lymphadenopathy:     Cervical: No cervical adenopathy.  Skin:    General: Skin is warm and dry.     Findings: No rash.  Neurological:     Mental Status: She is alert and oriented to person, place, and time.     Coordination: Coordination normal.  Psychiatric:        Behavior: Behavior normal.      Assessment & Plan:   Problem List Items Addressed This Visit       Cardiovascular and Mediastinum   Essential hypertension   Relevant Medications   fenofibrate 160 MG tablet   atorvastatin (LIPITOR) 20 MG tablet   lisinopril (ZESTRIL) 10 MG tablet   Other Relevant Orders   CMP14+EGFR     Digestive   GERD (gastroesophageal reflux disease)   Relevant Medications   omeprazole (PRILOSEC) 20 MG capsule   Other Relevant Orders   CBC with Differential/Platelet     Other   Overweight (BMI 25.0-29.9)   Relevant Medications   tirzepatide (MOUNJARO) 5 MG/0.5ML Pen   Mixed hyperlipidemia    Relevant Medications   fenofibrate 160 MG tablet   atorvastatin (LIPITOR) 20 MG tablet   lisinopril (ZESTRIL) 10 MG tablet   tirzepatide (MOUNJARO) 5 MG/0.5ML Pen   Other Relevant Orders   Lipid panel   Other Visit Diagnoses     Well woman exam with routine gynecological exam    -  Primary   Relevant Orders   CBC with Differential/Platelet   CMP14+EGFR   Lipid panel   Cytology - PAP([place])       Continue current medicine, seems to be doing well.  We will go up to next dose on the injectable to help with weight loss for her. Follow up plan: Return in about 6 months (around 10/12/2021), or if symptoms worsen or fail to improve, for Hypertension and cholesterol recheck.  Counseling provided for all of the vaccine components Orders Placed This Encounter  Procedures   CBC with Differential/Platelet   CMP14+EGFR   Lipid panel    Caryl Pina, MD [facility] 04/13/2021, 8:45 AM

## 2021-04-13 NOTE — Addendum Note (Signed)
Addended by: Arville Care on: 04/13/2021 10:52 AM   Modules accepted: Orders

## 2021-04-14 LAB — CBC WITH DIFFERENTIAL/PLATELET
Basophils Absolute: 0 10*3/uL (ref 0.0–0.2)
Basos: 1 %
EOS (ABSOLUTE): 0.2 10*3/uL (ref 0.0–0.4)
Eos: 4 %
Hematocrit: 43 % (ref 34.0–46.6)
Hemoglobin: 14.5 g/dL (ref 11.1–15.9)
Immature Grans (Abs): 0 10*3/uL (ref 0.0–0.1)
Immature Granulocytes: 1 %
Lymphocytes Absolute: 1.9 10*3/uL (ref 0.7–3.1)
Lymphs: 43 %
MCH: 29.8 pg (ref 26.6–33.0)
MCHC: 33.7 g/dL (ref 31.5–35.7)
MCV: 89 fL (ref 79–97)
Monocytes Absolute: 0.4 10*3/uL (ref 0.1–0.9)
Monocytes: 9 %
Neutrophils Absolute: 1.8 10*3/uL (ref 1.4–7.0)
Neutrophils: 42 %
Platelets: 286 10*3/uL (ref 150–450)
RBC: 4.86 x10E6/uL (ref 3.77–5.28)
RDW: 12.2 % (ref 11.7–15.4)
WBC: 4.3 10*3/uL (ref 3.4–10.8)

## 2021-04-14 LAB — CMP14+EGFR
ALT: 22 IU/L (ref 0–32)
AST: 23 IU/L (ref 0–40)
Albumin/Globulin Ratio: 2.8 — ABNORMAL HIGH (ref 1.2–2.2)
Albumin: 5.1 g/dL — ABNORMAL HIGH (ref 3.8–4.9)
Alkaline Phosphatase: 58 IU/L (ref 44–121)
BUN/Creatinine Ratio: 12 (ref 9–23)
BUN: 10 mg/dL (ref 6–24)
Bilirubin Total: 0.3 mg/dL (ref 0.0–1.2)
CO2: 24 mmol/L (ref 20–29)
Calcium: 10.5 mg/dL — ABNORMAL HIGH (ref 8.7–10.2)
Chloride: 100 mmol/L (ref 96–106)
Creatinine, Ser: 0.83 mg/dL (ref 0.57–1.00)
Globulin, Total: 1.8 g/dL (ref 1.5–4.5)
Glucose: 92 mg/dL (ref 70–99)
Potassium: 4.8 mmol/L (ref 3.5–5.2)
Sodium: 138 mmol/L (ref 134–144)
Total Protein: 6.9 g/dL (ref 6.0–8.5)
eGFR: 84 mL/min/{1.73_m2} (ref >59)

## 2021-04-14 LAB — CYTOLOGY - PAP: Diagnosis: NEGATIVE

## 2021-04-14 LAB — LIPID PANEL
Chol/HDL Ratio: 3 ratio (ref 0.0–4.4)
Cholesterol, Total: 182 mg/dL (ref 100–199)
HDL: 60 mg/dL (ref >39)
LDL Chol Calc (NIH): 102 mg/dL — ABNORMAL HIGH (ref 0–99)
Triglycerides: 113 mg/dL (ref 0–149)
VLDL Cholesterol Cal: 20 mg/dL (ref 5–40)

## 2021-05-01 ENCOUNTER — Other Ambulatory Visit: Payer: Self-pay | Admitting: Family Medicine

## 2021-05-01 DIAGNOSIS — Z1231 Encounter for screening mammogram for malignant neoplasm of breast: Secondary | ICD-10-CM

## 2021-05-04 ENCOUNTER — Other Ambulatory Visit: Payer: Self-pay | Admitting: Family Medicine

## 2021-05-04 DIAGNOSIS — K219 Gastro-esophageal reflux disease without esophagitis: Secondary | ICD-10-CM

## 2021-06-01 ENCOUNTER — Ambulatory Visit
Admission: RE | Admit: 2021-06-01 | Discharge: 2021-06-01 | Disposition: A | Discharge status: Home / Self Care | Payer: Medicaid Other | Source: Ambulatory Visit | Attending: Family Medicine | Admitting: Family Medicine

## 2021-06-01 DIAGNOSIS — Z1231 Encounter for screening mammogram for malignant neoplasm of breast: Secondary | ICD-10-CM

## 2021-08-12 ENCOUNTER — Other Ambulatory Visit: Payer: Self-pay | Admitting: Family Medicine

## 2021-08-12 DIAGNOSIS — E782 Mixed hyperlipidemia: Secondary | ICD-10-CM

## 2021-10-12 ENCOUNTER — Encounter: Payer: Self-pay | Admitting: Family Medicine

## 2021-10-12 ENCOUNTER — Ambulatory Visit (INDEPENDENT_AMBULATORY_CARE_PROVIDER_SITE_OTHER): Payer: BC Managed Care – PPO | Admitting: Family Medicine

## 2021-10-12 VITALS — BP 115/81 | HR 65 | Temp 97.6°F | Ht 63.0 in | Wt 150.0 lb

## 2021-10-12 DIAGNOSIS — E782 Mixed hyperlipidemia: Secondary | ICD-10-CM | POA: Diagnosis not present

## 2021-10-12 DIAGNOSIS — K219 Gastro-esophageal reflux disease without esophagitis: Secondary | ICD-10-CM

## 2021-10-12 DIAGNOSIS — I10 Essential (primary) hypertension: Secondary | ICD-10-CM | POA: Diagnosis not present

## 2021-10-12 LAB — CBC WITH DIFFERENTIAL/PLATELET
Basophils Absolute: 0 10*3/uL (ref 0.0–0.2)
Basos: 1 %
EOS (ABSOLUTE): 0.2 10*3/uL (ref 0.0–0.4)
Eos: 4 %
Hematocrit: 39.4 % (ref 34.0–46.6)
Hemoglobin: 13.2 g/dL (ref 11.1–15.9)
Immature Grans (Abs): 0 10*3/uL (ref 0.0–0.1)
Immature Granulocytes: 0 %
Lymphocytes Absolute: 1.6 10*3/uL (ref 0.7–3.1)
Lymphs: 40 %
MCH: 30.6 pg (ref 26.6–33.0)
MCHC: 33.5 g/dL (ref 31.5–35.7)
MCV: 91 fL (ref 79–97)
Monocytes Absolute: 0.4 10*3/uL (ref 0.1–0.9)
Monocytes: 10 %
Neutrophils Absolute: 1.9 10*3/uL (ref 1.4–7.0)
Neutrophils: 45 %
Platelets: 295 10*3/uL (ref 150–450)
RBC: 4.31 x10E6/uL (ref 3.77–5.28)
RDW: 12.6 % (ref 11.7–15.4)
WBC: 4.1 10*3/uL (ref 3.4–10.8)

## 2021-10-12 LAB — CMP14+EGFR
ALT: 22 IU/L (ref 0–32)
AST: 22 IU/L (ref 0–40)
Albumin/Globulin Ratio: 2.4 — ABNORMAL HIGH (ref 1.2–2.2)
Albumin: 4.6 g/dL (ref 3.8–4.9)
Alkaline Phosphatase: 67 IU/L (ref 44–121)
BUN/Creatinine Ratio: 15 (ref 9–23)
BUN: 13 mg/dL (ref 6–24)
Bilirubin Total: 0.4 mg/dL (ref 0.0–1.2)
CO2: 22 mmol/L (ref 20–29)
Calcium: 10 mg/dL (ref 8.7–10.2)
Chloride: 103 mmol/L (ref 96–106)
Creatinine, Ser: 0.85 mg/dL (ref 0.57–1.00)
Globulin, Total: 1.9 g/dL (ref 1.5–4.5)
Glucose: 96 mg/dL (ref 70–99)
Potassium: 4.7 mmol/L (ref 3.5–5.2)
Sodium: 140 mmol/L (ref 134–144)
Total Protein: 6.5 g/dL (ref 6.0–8.5)
eGFR: 81 mL/min/{1.73_m2} (ref >59)

## 2021-10-12 LAB — LIPID PANEL
Chol/HDL Ratio: 2.6 ratio (ref 0.0–4.4)
Cholesterol, Total: 180 mg/dL (ref 100–199)
HDL: 70 mg/dL (ref >39)
LDL Chol Calc (NIH): 91 mg/dL (ref 0–99)
Triglycerides: 110 mg/dL (ref 0–149)
VLDL Cholesterol Cal: 19 mg/dL (ref 5–40)

## 2021-10-12 MED ORDER — FENOFIBRATE 160 MG PO TABS: 160.0000 mg | ORAL_TABLET | Freq: Every day | ORAL | 3 refills | Status: DC

## 2021-10-12 NOTE — Progress Notes (Signed)
BP 115/81   Pulse 65   Temp 97.6 F (36.4 C)   Ht _0  (1.6 m)   Wt 150 lb (68 kg)   SpO2 98%   BMI 26.57 kg/m    Subjective:   Patient ID: Alexis [name], female    DOB: [birth year], 56 y.o.   MRN: [id number]  HPI: Alexis [name] is a 56 y.o. female presenting on 10/12/2021 for Medical Management of Chronic Issues and Hyperlipidemia   HPI Hypertension Patient is currently on lisinopril, and their blood pressure today is 115/81. Patient denies any lightheadedness or dizziness. Patient denies headaches, blurred vision, chest pains, shortness of breath, or weakness. Denies any side effects from medication and is content with current medication.   Hyperlipidemia Patient is coming in for recheck of his hyperlipidemia. The patient is currently taking atorvastatin and fenofibrate. They deny any issues with myalgias or history of liver damage from it. They deny any focal numbness or weakness or chest pain.   GERD Patient is currently on omeprazole.  She denies any major symptoms or abdominal pain or belching or burping. She denies any blood in her stool or lightheadedness or dizziness.   Relevant past medical, surgical, family and social history reviewed and updated as indicated. Interim medical history since our last visit reviewed. Allergies and medications reviewed and updated.  Review of Systems  Constitutional:  Negative for chills and fever.  HENT:  Negative for congestion, ear discharge and ear pain.   Eyes:  Negative for redness and visual disturbance.  Respiratory:  Negative for chest tightness and shortness of breath.   Cardiovascular:  Negative for chest pain and leg swelling.  Genitourinary:  Negative for difficulty urinating and dysuria.  Musculoskeletal:  Negative for back pain and gait problem.  Skin:  Negative for rash.  Neurological:  Positive for light-headedness. Negative for headaches.  Psychiatric/Behavioral:  Negative for agitation and behavioral problems.    All other systems reviewed and are negative.   Per HPI unless specifically indicated above   Allergies as of 10/12/2021       Reactions   Azithromycin [name]   After a few days   Codeine [name]   Penicillins [name]   Has patient had a PCN reaction causing immediate rash, facial/tongue/throat swelling, SOB or lightheadedness with hypotension: NO Has patient had a PCN reaction causing severe rash involving mucus membranes or skin necrosis: No Has patient had a PCN reaction that required hospitalization: No Has patient had a PCN reaction occurring within the last 10 years: No If all of the above answers are "NO", then may proceed with Cephalosporin use.   Amlodipine Swelling   Pedal edema   Tetracyclines & Related Nausea And Vomiting   GI distress   Tramadol Itching   Side effect        Medication List        Accurate as of October 12, 2021  8:39 AM. If you have any questions, ask your nurse or doctor.          STOP taking these medications    tirzepatide 5 MG/0.5ML Pen Commonly known as: MOUNJARO Stopped by: Worthy Rancher, MD   tirzepatide 7.5 MG/0.5ML Pen Commonly known as: MOUNJARO Stopped by: Fransisca Kaufmann Osmar Howton, MD       TAKE these medications    atorvastatin 20 MG tablet Commonly known as: LIPITOR Take 1 tablet (20 mg total) by mouth at bedtime.   cyclobenzaprine 5 MG tablet Commonly known as: FLEXERIL TAKE 1 TABLET  THREE TIMES DAILY AS NEEDED FOR MUSCLE SPASM   fenofibrate 160 MG tablet Take 1 tablet (160 mg total) by mouth daily.   fexofenadine 180 MG tablet Commonly known as: ALLEGRA Take 180 mg by mouth daily as needed for allergies.   lisinopril 10 MG tablet Commonly known as: ZESTRIL Take 1 tablet (10 mg total) by mouth daily.   melatonin 3 MG Tabs tablet Take 3 mg by mouth at bedtime.   omeprazole 20 MG capsule Commonly known as: PRILOSEC TAKE 1 CAPSULE ONCE DAILY   traZODone 50 MG tablet Commonly known as: DESYREL TAKE  ONE-HALF TO 1 TABLET AT BEDTIME FOR SLEEP         Objective:   BP 115/81   Pulse 65   Temp 97.6 F (36.4 C)   Ht _0  (1.6 m)   Wt 150 lb (68 kg)   SpO2 98%   BMI 26.57 kg/m   Wt Readings from Last 3 Encounters:  10/12/21 150 lb (68 kg)  04/13/21 149 lb (67.6 kg)  10/08/20 161 lb (73 kg)    Physical Exam Vitals and nursing note reviewed.  Constitutional:      General: She is not in acute distress.    Appearance: She is well-developed. She is not diaphoretic.  Eyes:     Conjunctiva/sclera: Conjunctivae normal.  Cardiovascular:     Rate and Rhythm: Normal rate and regular rhythm.     Heart sounds: Normal heart sounds. No murmur heard. Pulmonary:     Effort: Pulmonary effort is normal. No respiratory distress.     Breath sounds: Normal breath sounds. No wheezing.  Musculoskeletal:        General: No tenderness. Normal range of motion.  Skin:    General: Skin is warm and dry.     Findings: No rash.  Neurological:     Mental Status: She is alert and oriented to person, place, and time.     Coordination: Coordination normal.  Psychiatric:        Behavior: Behavior normal.       Assessment & Plan:   Problem List Items Addressed This Visit       Cardiovascular and Mediastinum   Essential hypertension - Primary   Relevant Medications   fenofibrate 160 MG tablet   Other Relevant Orders   CBC with Differential/Platelet   CMP14+EGFR     Digestive   GERD (gastroesophageal reflux disease)   Relevant Orders   CBC with Differential/Platelet     Other   Mixed hyperlipidemia   Relevant Medications   fenofibrate 160 MG tablet   Other Relevant Orders   Lipid panel    No complaints except for 1 episode of lightheadedness after giving blood.  She does not know if it is related to her sinuses or not but it has not happened too frequently.  She will trial off of her lisinopril for a month and check her blood pressure regularly and let us know how it is  running. Follow up plan: Return in about 6 months (around 04/13/2022), or if symptoms worsen or fail to improve, for Physical exam and hypertension and cholesterol.  Counseling provided for all of the vaccine components Orders Placed This Encounter  Procedures   CBC with Differential/Platelet   CMP14+EGFR   Lipid panel    Caryl Pina, MD [facility] 10/12/2021, 8:39 AM

## 2021-12-11 ENCOUNTER — Other Ambulatory Visit: Payer: Self-pay | Admitting: Family Medicine

## 2022-03-09 ENCOUNTER — Encounter: Payer: Self-pay | Admitting: Nurse Practitioner

## 2022-03-09 ENCOUNTER — Ambulatory Visit (INDEPENDENT_AMBULATORY_CARE_PROVIDER_SITE_OTHER): Payer: BC Managed Care – PPO

## 2022-03-09 ENCOUNTER — Ambulatory Visit (INDEPENDENT_AMBULATORY_CARE_PROVIDER_SITE_OTHER): Payer: BC Managed Care – PPO | Admitting: Nurse Practitioner

## 2022-03-09 ENCOUNTER — Other Ambulatory Visit: Payer: Self-pay | Admitting: Nurse Practitioner

## 2022-03-09 VITALS — BP 128/85 | HR 82 | Temp 98.0°F | Ht 63.0 in | Wt 151.0 lb

## 2022-03-09 DIAGNOSIS — S20219A Contusion of unspecified front wall of thorax, initial encounter: Secondary | ICD-10-CM

## 2022-03-09 DIAGNOSIS — R0781 Pleurodynia: Secondary | ICD-10-CM

## 2022-03-09 MED ORDER — IBUPROFEN 800 MG PO TABS: 800.0000 mg | ORAL_TABLET | Freq: Three times a day (TID) | ORAL | 0 refills | Status: DC | PRN

## 2022-03-09 NOTE — Progress Notes (Signed)
Subjective:    Patient ID: Alexis [name], female    DOB: 04-25-1966, 56 y.o.   MRN: [id number]   Chief Complaint: Fall (Left rib pain, feels pulling in chest )   [address] on uneven pavement last Friday and tripped over dog last night.  Fall The accident occurred 3 to 5 days ago. The fall occurred while walking (fell on uneven pavement last Friday; tripped over dog and landed on carpet last night). Alexis [name] landed on [place] (last friday fell on pavement; last night landed on carpet when Alexis [name] tripped over dog). Point of impact: pt landed on L side (Friday) and landed on R shoulder (last night) Pain location: L ribs just onder breast; R upper chest and shoulder. The pain is at a severity of 9/10. The symptoms are aggravated by movement and pressure on injury. Pertinent negatives include no headaches, loss of consciousness, numbness, tingling or visual change. Alexis [name] has tried NSAID for the symptoms. The treatment provided mild relief.       Review of Systems  Constitutional:  Negative for fatigue.  Eyes:  Negative for visual disturbance.  Respiratory:  Negative for chest tightness and shortness of breath.   Cardiovascular:  Negative for chest pain.  Musculoskeletal:        L rib pain; muscular pain to R upper chest   Neurological:  Negative for dizziness, tingling, loss of consciousness, syncope, light-headedness, numbness and headaches.  All other systems reviewed and are negative.      Objective:   Physical Exam Vitals and nursing note reviewed.  Constitutional:      General: Alexis [name] is not in acute distress.    Appearance: Normal appearance.  Cardiovascular:     Rate and Rhythm: Normal rate and regular rhythm.     Heart sounds: Normal heart sounds.  Pulmonary:     Effort: Pulmonary effort is normal. No respiratory distress.     Breath sounds: Normal breath sounds. No wheezing, rhonchi or rales.     Comments: Tenderness to L anterior ribcage Chest:     Chest wall: Tenderness present.  No deformity, swelling or crepitus.     Comments: Tenderness to L ribcage Musculoskeletal:        General: No swelling.     Right shoulder: Normal. No swelling, deformity, tenderness or crepitus. Normal range of motion.     Left shoulder: Normal.     Cervical back: Normal range of motion.  Skin:    General: Skin is warm and dry.     Findings: No bruising, erythema or lesion.  Neurological:     General: No focal deficit present.     Mental Status: Alexis [name] is alert and oriented to person, place, and time.  Psychiatric:        Mood and Affect: Mood normal.        Behavior: Behavior normal.     BP 128/85   Pulse 82   Temp 98 F (36.7 C)   Ht 5\' 3"  (1.6 m)   Wt 151 lb (68.5 kg)   SpO2 99%   BMI 26.75 kg/m   Chest xray- no fracture-Mary-Margaret Hassell Done, FNP      Assessment & Plan:  Alexis [name] in today with chief complaint of Fall (Left rib pain, feels pulling in chest )   1. Rib pain - DG Ribs Bilateral W/Chest  2. Contusion of rib, unspecified laterality, initial encounter Moist heat Rest Cough and deep breathe while splinting every2 hours Alternate motrin and tylenol every 3 hours  Meds ordered this encounter  Medications   ibuprofen (ADVIL) 800 MG tablet    Sig: Take 1 tablet (800 mg total) by mouth every 8 (eight) hours as needed.    Dispense:  30 tablet    Refill:  0    Order Specific Question:   Supervising Provider    Answer:   Caryl Pina A [id number]     The above assessment and management plan was discussed with the patient. The patient verbalized understanding of and has agreed to the management plan. Patient is aware to call the clinic if symptoms persist or worsen. Patient is aware when to return to the clinic for a follow-up visit. Patient educated on when it is appropriate to go to the emergency department.   Mary-Margaret Hassell Done, FNP

## 2022-04-07 ENCOUNTER — Other Ambulatory Visit: Payer: Self-pay | Admitting: Family Medicine

## 2022-04-07 DIAGNOSIS — E782 Mixed hyperlipidemia: Secondary | ICD-10-CM

## 2022-04-14 ENCOUNTER — Ambulatory Visit: Payer: BC Managed Care – PPO | Admitting: Family Medicine

## 2022-04-22 ENCOUNTER — Ambulatory Visit (INDEPENDENT_AMBULATORY_CARE_PROVIDER_SITE_OTHER): Payer: BC Managed Care – PPO | Admitting: Family Medicine

## 2022-04-22 ENCOUNTER — Encounter: Payer: Self-pay | Admitting: Family Medicine

## 2022-04-22 VITALS — BP 129/93 | HR 84 | Ht 63.0 in | Wt 154.0 lb

## 2022-04-22 DIAGNOSIS — E782 Mixed hyperlipidemia: Secondary | ICD-10-CM

## 2022-04-22 DIAGNOSIS — I10 Essential (primary) hypertension: Secondary | ICD-10-CM | POA: Diagnosis not present

## 2022-04-22 DIAGNOSIS — Z01419 Encounter for gynecological examination (general) (routine) without abnormal findings: Secondary | ICD-10-CM | POA: Diagnosis not present

## 2022-04-22 DIAGNOSIS — K219 Gastro-esophageal reflux disease without esophagitis: Secondary | ICD-10-CM

## 2022-04-22 DIAGNOSIS — Z01411 Encounter for gynecological examination (general) (routine) with abnormal findings: Secondary | ICD-10-CM

## 2022-04-22 MED ORDER — FENOFIBRATE 160 MG PO TABS: 160.0000 mg | ORAL_TABLET | Freq: Every day | ORAL | 3 refills | Status: DC

## 2022-04-22 MED ORDER — OMEPRAZOLE 20 MG PO CPDR: 20.0000 mg | DELAYED_RELEASE_CAPSULE | Freq: Every day | ORAL | 3 refills | Status: DC

## 2022-04-22 MED ORDER — ATORVASTATIN CALCIUM 20 MG PO TABS: 20.0000 mg | ORAL_TABLET | Freq: Every day | ORAL | 3 refills | Status: DC

## 2022-04-22 NOTE — Patient Instructions (Signed)
Kegel Exercises  Kegel exercises can help strengthen your pelvic floor muscles. The pelvic floor is a group of muscles that support your rectum, small intestine, and bladder. In females, pelvic floor muscles also help support the uterus. These muscles help you control the flow of urine and stool (feces). Kegel exercises are painless and simple. They do not require any equipment. Your provider may suggest Kegel exercises to: Improve bladder and bowel control. Improve sexual response. Improve weak pelvic floor muscles after surgery to remove the uterus (hysterectomy) or after pregnancy, in females. Improve weak pelvic floor muscles after prostate gland removal or surgery, in males. Kegel exercises involve squeezing your pelvic floor muscles. These are the same muscles you squeeze when you try to stop the flow of urine or keep from passing gas. The exercises can be done while sitting, standing, or lying down, but it is best to vary your position. Ask your health care provider which exercises are safe for you. Do exercises exactly as told by your health care provider and adjust them as directed. Do not begin these exercises until told by your health care provider. Exercises How to do Kegel exercises: Squeeze your pelvic floor muscles tight. You should feel a tight lift in your rectal area. If you are a female, you should also feel a tightness in your vaginal area. Keep your stomach, buttocks, and legs relaxed. Hold the muscles tight for up to 10 seconds. Breathe normally. Relax your muscles for up to 10 seconds. Repeat as told by your health care provider. Repeat this exercise daily as told by your health care provider. Continue to do this exercise for at least 4-6 weeks, or for as long as told by your health care provider. You may be referred to a physical therapist who can help you learn more about how to do Kegel exercises. Depending on your condition, your health care provider may  recommend: Varying how long you squeeze your muscles. Doing several sets of exercises every day. Doing exercises for several weeks. Making Kegel exercises a part of your regular exercise routine. This information is not intended to replace advice given to you by your health care provider. Make sure you discuss any questions you have with your health care provider. Document Revised: 08/28/2020 Document Reviewed: 08/28/2020 Elsevier Patient Education  2023 [employer]  

## 2022-04-22 NOTE — Progress Notes (Signed)
BP (!) 129/93   Pulse 84   Ht _0  (1.6 m)   Wt 154 lb (69.9 kg)   SpO2 96%   BMI 27.28 kg/m    Subjective:   Patient ID: Alexis [name], female    DOB: 1965/07/15, 56 y.o.   MRN: [id number]  HPI: Alexis [name] is a 56 y.o. female presenting on 04/22/2022 for Medical Management of Chronic Issues (CPE with pap)   HPI Well woman exam Patient denies any chest pain, shortness of breath, headaches or vision issues, abdominal complaints, diarrhea, nausea, vomiting, or joint issues.   Hypertension Patient is currently on no medication currently, diet control, and their blood pressure today is 129/93. Patient denies any lightheadedness or dizziness. Patient denies headaches, blurred vision, chest pains, shortness of breath, or weakness. Denies any side effects from medication and is content with current medication.   Hyperlipidemia Patient is coming in for recheck of his hyperlipidemia. The patient is currently taking atorvastatin and fenofibrate. They deny any issues with myalgias or history of liver damage from it. They deny any focal numbness or weakness or chest pain.   GERD Patient is currently on omeprazole.  She denies any major symptoms or abdominal pain or belching or burping. She denies any blood in her stool or lightheadedness or dizziness.   Relevant past medical, surgical, family and social history reviewed and updated as indicated. Interim medical history since our last visit reviewed. Allergies and medications reviewed and updated.  Review of Systems  Constitutional:  Negative for chills and fever.  HENT:  Negative for congestion, ear discharge, ear pain and tinnitus.   Eyes:  Negative for pain, redness and visual disturbance.  Respiratory:  Negative for cough, chest tightness, shortness of breath and wheezing.   Cardiovascular:  Negative for chest pain, palpitations and leg swelling.  Gastrointestinal:  Negative for abdominal pain, blood in stool, constipation and  diarrhea.  Genitourinary:  Positive for frequency. Negative for decreased urine volume, difficulty urinating, dysuria, hematuria, pelvic pain, vaginal bleeding, vaginal discharge and vaginal pain.  Musculoskeletal:  Negative for back pain, gait problem and myalgias.  Skin:  Negative for rash.  Neurological:  Negative for dizziness, weakness, light-headedness and headaches.  Psychiatric/Behavioral:  Negative for agitation, behavioral problems and suicidal ideas.   All other systems reviewed and are negative.   Per HPI unless specifically indicated above   Allergies as of 04/22/2022       Reactions   Azithromycin [name]   After a few days   Codeine [name]   Penicillins [name]   Has patient had a PCN reaction causing immediate rash, facial/tongue/throat swelling, SOB or lightheadedness with hypotension: NO Has patient had a PCN reaction causing severe rash involving mucus membranes or skin necrosis: No Has patient had a PCN reaction that required hospitalization: No Has patient had a PCN reaction occurring within the last 10 years: No If all of the above answers are "NO", then may proceed with Cephalosporin use.   Amlodipine Swelling   Pedal edema   Tetracyclines & Related Nausea And Vomiting   GI distress   Tramadol Itching   Side effect        Medication List        Accurate as of April 22, 2022  8:29 AM. If you have any questions, ask your nurse or doctor.          STOP taking these medications    ibuprofen 800 MG tablet Commonly known as: ADVIL Stopped by: Alexis [name]  Alexis Soderberg, MD   melatonin 3 MG Tabs tablet Stopped by: Alexis [name] Alexis Reihl, MD       TAKE these medications    atorvastatin 20 MG tablet Commonly known as: LIPITOR Take 1 tablet (20 mg total) by mouth at bedtime.   cyclobenzaprine 5 MG tablet Commonly known as: FLEXERIL TAKE 1 TABLET THREE TIMES DAILY AS NEEDED FOR MUSCLE SPASM   fenofibrate 160 MG tablet Take 1 tablet (160 mg total) by  mouth daily.   fexofenadine 180 MG tablet Commonly known as: ALLEGRA Take 180 mg by mouth daily as needed for allergies.   omeprazole 20 MG capsule Commonly known as: PRILOSEC Take 1 capsule (20 mg total) by mouth daily.   traZODone 50 MG tablet Commonly known as: DESYREL TAKE ONE-HALF TO 1 TABLET AT BEDTIME FOR SLEEP         Objective:   BP (!) 129/93   Pulse 84   Ht _0  (1.6 m)   Wt 154 lb (69.9 kg)   SpO2 96%   BMI 27.28 kg/m   Wt Readings from Last 3 Encounters:  04/22/22 154 lb (69.9 kg)  03/09/22 151 lb (68.5 kg)  10/12/21 150 lb (68 kg)    Physical Exam Vitals and nursing note reviewed.  Constitutional:      General: She is not in acute distress.    Appearance: She is well-developed. She is not diaphoretic.  Eyes:     Conjunctiva/sclera: Conjunctivae normal.  Neck:     Thyroid: No thyromegaly.  Cardiovascular:     Rate and Rhythm: Normal rate and regular rhythm.     Heart sounds: Normal heart sounds. No murmur heard. Pulmonary:     Effort: Pulmonary effort is normal. No respiratory distress.     Breath sounds: Normal breath sounds. No wheezing.  Chest:  Breasts:    Breasts are symmetrical.     Right: No inverted nipple, mass, nipple discharge, skin change or tenderness.     Left: No inverted nipple, mass, nipple discharge, skin change or tenderness.  Abdominal:     General: Bowel sounds are normal. There is no distension.     Palpations: Abdomen is soft.     Tenderness: There is no abdominal tenderness. There is no guarding or rebound.  Genitourinary:    Exam position: Supine.     Labia:        Right: No rash or lesion.        Left: No rash or lesion.      Vagina: Normal.     Cervix: No cervical motion tenderness, discharge or friability.     Uterus: Not deviated, not enlarged, not fixed and not tender.      Adnexa:        Right: No mass or tenderness.         Left: No mass or tenderness.    Musculoskeletal:        General: No tenderness.  Normal range of motion.     Cervical back: Neck supple.  Lymphadenopathy:     Cervical: No cervical adenopathy.  Skin:    General: Skin is warm and dry.     Findings: No rash.  Neurological:     Mental Status: She is alert and oriented to person, place, and time.     Coordination: Coordination normal.  Psychiatric:        Behavior: Behavior normal.       Assessment & Plan:   Problem List Items Addressed This Visit  Cardiovascular and Mediastinum   Essential hypertension   Relevant Medications   atorvastatin (LIPITOR) 20 MG tablet   fenofibrate 160 MG tablet     Digestive   GERD (gastroesophageal reflux disease)   Relevant Medications   omeprazole (PRILOSEC) 20 MG capsule     Other   Mixed hyperlipidemia   Relevant Medications   atorvastatin (LIPITOR) 20 MG tablet   fenofibrate 160 MG tablet   Other Visit Diagnoses     Well woman exam    -  Primary   Relevant Orders   MM 3D SCREEN BREAST BILATERAL   CBC with Differential/Platelet   CMP14+EGFR   Lipid panel       Will set up for mammogram in January, patient is having some urinary frequency and leakage but not all of the time. Gave information about pelvic floor strengthening exercises. Follow up plan: Return in about 6 months (around 10/22/2022), or if symptoms worsen or fail to improve, for Recheck hypertension and cholesterol.  Counseling provided for all of the vaccine components Orders Placed This Encounter  Procedures   MM 3D SCREEN BREAST BILATERAL   CBC with Differential/Platelet   CMP14+EGFR   Lipid panel    Caryl Pina, MD [facility] 04/22/2022, 8:29 AM

## 2022-04-23 LAB — CBC WITH DIFFERENTIAL/PLATELET
Basophils Absolute: 0 10*3/uL (ref 0.0–0.2)
Basos: 1 %
EOS (ABSOLUTE): 0.2 10*3/uL (ref 0.0–0.4)
Eos: 4 %
Hematocrit: 43.3 % (ref 34.0–46.6)
Hemoglobin: 14.7 g/dL (ref 11.1–15.9)
Immature Grans (Abs): 0 10*3/uL (ref 0.0–0.1)
Immature Granulocytes: 0 %
Lymphocytes Absolute: 1.7 10*3/uL (ref 0.7–3.1)
Lymphs: 41 %
MCH: 29.9 pg (ref 26.6–33.0)
MCHC: 33.9 g/dL (ref 31.5–35.7)
MCV: 88 fL (ref 79–97)
Monocytes Absolute: 0.3 10*3/uL (ref 0.1–0.9)
Monocytes: 8 %
Neutrophils Absolute: 1.9 10*3/uL (ref 1.4–7.0)
Neutrophils: 46 %
Platelets: 273 10*3/uL (ref 150–450)
RBC: 4.92 x10E6/uL (ref 3.77–5.28)
RDW: 12 % (ref 11.7–15.4)
WBC: 4.1 10*3/uL (ref 3.4–10.8)

## 2022-04-23 LAB — CMP14+EGFR
ALT: 22 IU/L (ref 0–32)
AST: 25 IU/L (ref 0–40)
Albumin/Globulin Ratio: 2.5 — ABNORMAL HIGH (ref 1.2–2.2)
Albumin: 4.9 g/dL (ref 3.8–4.9)
Alkaline Phosphatase: 76 IU/L (ref 44–121)
BUN/Creatinine Ratio: 16 (ref 9–23)
BUN: 14 mg/dL (ref 6–24)
Bilirubin Total: 0.4 mg/dL (ref 0.0–1.2)
CO2: 22 mmol/L (ref 20–29)
Calcium: 10.3 mg/dL — ABNORMAL HIGH (ref 8.7–10.2)
Chloride: 103 mmol/L (ref 96–106)
Creatinine, Ser: 0.86 mg/dL (ref 0.57–1.00)
Globulin, Total: 2 g/dL (ref 1.5–4.5)
Glucose: 96 mg/dL (ref 70–99)
Potassium: 4.2 mmol/L (ref 3.5–5.2)
Sodium: 140 mmol/L (ref 134–144)
Total Protein: 6.9 g/dL (ref 6.0–8.5)
eGFR: 80 mL/min/{1.73_m2} (ref >59)

## 2022-04-23 LAB — LIPID PANEL
Chol/HDL Ratio: 3.1 ratio (ref 0.0–4.4)
Cholesterol, Total: 201 mg/dL — ABNORMAL HIGH (ref 100–199)
HDL: 64 mg/dL (ref >39)
LDL Chol Calc (NIH): 111 mg/dL — ABNORMAL HIGH (ref 0–99)
Triglycerides: 148 mg/dL (ref 0–149)
VLDL Cholesterol Cal: 26 mg/dL (ref 5–40)

## 2022-06-07 ENCOUNTER — Ambulatory Visit
Admission: RE | Admit: 2022-06-07 | Discharge: 2022-06-07 | Disposition: A | Discharge status: Home / Self Care | Payer: BC Managed Care – PPO | Source: Ambulatory Visit | Attending: Family Medicine | Admitting: Family Medicine

## 2022-06-07 DIAGNOSIS — Z1231 Encounter for screening mammogram for malignant neoplasm of breast: Secondary | ICD-10-CM | POA: Diagnosis not present

## 2022-06-07 DIAGNOSIS — Z01419 Encounter for gynecological examination (general) (routine) without abnormal findings: Secondary | ICD-10-CM

## 2022-06-08 ENCOUNTER — Other Ambulatory Visit: Payer: Self-pay | Admitting: Family Medicine

## 2022-10-25 ENCOUNTER — Ambulatory Visit (INDEPENDENT_AMBULATORY_CARE_PROVIDER_SITE_OTHER): Payer: BC Managed Care – PPO | Admitting: Family Medicine

## 2022-10-25 ENCOUNTER — Encounter: Payer: Self-pay | Admitting: Family Medicine

## 2022-10-25 VITALS — BP 159/95 | HR 69 | Ht 63.0 in | Wt 155.0 lb

## 2022-10-25 DIAGNOSIS — E782 Mixed hyperlipidemia: Secondary | ICD-10-CM

## 2022-10-25 DIAGNOSIS — K219 Gastro-esophageal reflux disease without esophagitis: Secondary | ICD-10-CM

## 2022-10-25 DIAGNOSIS — I10 Essential (primary) hypertension: Secondary | ICD-10-CM

## 2022-10-25 MED ORDER — TRAZODONE HCL 50 MG PO TABS: 50.0000 mg | ORAL_TABLET | Freq: Every evening | ORAL | 1 refills | Status: DC | PRN

## 2022-10-25 NOTE — Progress Notes (Signed)
BP (!) 159/95   Pulse 69   Ht 5\' 3"  (1.6 m)   Wt 155 lb (70.3 kg)   SpO2 96%   BMI 27.46 kg/m    Subjective:   Patient ID: Alexis [name], female    DOB: Dec 15, 1965, 57 y.o.   MRN: [id number]  HPI: Alexis [name] is a 57 y.o. female presenting on 10/25/2022 for Medical Management of Chronic Issues, Hyperlipidemia, and Hypertension   HPI Hypertension Patient is currently on no medicine, her blood pressure has been under control because it been running better throughout the past year., and their blood pressure today is 147/90 and 159/99. Patient denies any lightheadedness or dizziness. Patient denies headaches, blurred vision, chest pains, shortness of breath, or weakness. Denies any side effects from medication and is content with current medication.   Hyperlipidemia Patient is coming in for recheck of his hyperlipidemia. The patient is currently taking fenofibrate and atorvastatin. They deny any issues with myalgias or history of liver damage from it. They deny any focal numbness or weakness or chest pain.   GERD Patient is currently on omeprazole.  She denies any major symptoms or abdominal pain or belching or burping. She denies any blood in her stool or lightheadedness or dizziness.   Relevant past medical, surgical, family and social history reviewed and updated as indicated. Interim medical history since our last visit reviewed. Allergies and medications reviewed and updated.  Review of Systems  Constitutional:  Negative for chills and fever.  Eyes:  Negative for redness and visual disturbance.  Respiratory:  Negative for chest tightness and shortness of breath.   Cardiovascular:  Negative for chest pain and leg swelling.  Genitourinary:  Negative for difficulty urinating and dysuria.  Musculoskeletal:  Negative for back pain and gait problem.  Skin:  Negative for rash.  Neurological:  Negative for dizziness, light-headedness and headaches.  Psychiatric/Behavioral:   Negative for agitation and behavioral problems.   All other systems reviewed and are negative.   Per HPI unless specifically indicated above   Allergies as of 10/25/2022       Reactions   Azithromycin Hives   After a few days   Codeine Hives   Penicillins Hives   Has patient had a PCN reaction causing immediate rash, facial/tongue/throat swelling, SOB or lightheadedness with hypotension: NO Has patient had a PCN reaction causing severe rash involving mucus membranes or skin necrosis: No Has patient had a PCN reaction that required hospitalization: No Has patient had a PCN reaction occurring within the last 10 years: No If all of the above answers are "NO", then may proceed with Cephalosporin use.   Amlodipine Swelling   Pedal edema   Tetracyclines & Related Nausea And Vomiting   GI distress   Tramadol Itching   Side effect        Medication List        Accurate as of October 25, 2022  8:16 AM. If you have any questions, ask your nurse or doctor.          atorvastatin 20 MG tablet Commonly known as: LIPITOR Take 1 tablet (20 mg total) by mouth at bedtime.   cyclobenzaprine 5 MG tablet Commonly known as: FLEXERIL TAKE 1 TABLET THREE TIMES DAILY AS NEEDED FOR MUSCLE SPASM   fenofibrate 160 MG tablet Take 1 tablet (160 mg total) by mouth daily.   fexofenadine 180 MG tablet Commonly known as: ALLEGRA Take 180 mg by mouth daily as needed for allergies.   omeprazole 20  MG capsule Commonly known as: PRILOSEC Take 1 capsule (20 mg total) by mouth daily.   traZODone 50 MG tablet Commonly known as: DESYREL Take 1 tablet (50 mg total) by mouth at bedtime as needed for sleep. What changed: See the new instructions. Changed by: Elige Radon Emanuel Campos, MD         Objective:   BP (!) 159/95   Pulse 69   Ht 5\' 3"  (1.6 m)   Wt 155 lb (70.3 kg)   SpO2 96%   BMI 27.46 kg/m   Wt Readings from Last 3 Encounters:  10/25/22 155 lb (70.3 kg)  04/22/22 154 lb (69.9 kg)   03/09/22 151 lb (68.5 kg)    Physical Exam Vitals and nursing note reviewed.  Constitutional:      General: She is not in acute distress.    Appearance: She is well-developed. She is not diaphoretic.  Eyes:     Conjunctiva/sclera: Conjunctivae normal.  Cardiovascular:     Rate and Rhythm: Normal rate and regular rhythm.     Heart sounds: Normal heart sounds. No murmur heard. Pulmonary:     Effort: Pulmonary effort is normal. No respiratory distress.     Breath sounds: Normal breath sounds. No wheezing.  Musculoskeletal:        General: No swelling or tenderness. Normal range of motion.  Skin:    General: Skin is warm and dry.     Findings: No rash.  Neurological:     Mental Status: She is alert and oriented to person, place, and time.     Coordination: Coordination normal.  Psychiatric:        Behavior: Behavior normal.       Assessment & Plan:   Problem List Items Addressed This Visit       Cardiovascular and Mediastinum   Essential hypertension - Primary   Relevant Orders   CBC with Differential/Platelet   CMP14+EGFR   Lipid panel     Digestive   GERD (gastroesophageal reflux disease)   Relevant Orders   CBC with Differential/Platelet   CMP14+EGFR   Lipid panel     Other   Mixed hyperlipidemia   Relevant Orders   CBC with Differential/Platelet   CMP14+EGFR   Lipid panel    Patient's blood pressure is up today but she has been a little more stressed recently with that is not feeling well, she has been since 4 AM trying to feed the calf.  She says she did get her blood pressure checked somewhere else just couple weeks ago and it was 114 systolic. Monitor blood pressure for now but likely due to stress and it is elevated. Follow up plan: Return in about 6 months (around 04/26/2023), or if symptoms worsen or fail to improve, for Physical exam.  Counseling provided for all of the vaccine components Orders Placed This Encounter  Procedures   CBC with  Differential/Platelet   CMP14+EGFR   Lipid panel    Arville Care, MD [facility] 10/25/2022, 8:16 AM

## 2022-10-26 LAB — CBC WITH DIFFERENTIAL/PLATELET
Basophils Absolute: 0 10*3/uL (ref 0.0–0.2)
Basos: 1 %
EOS (ABSOLUTE): 0.2 10*3/uL (ref 0.0–0.4)
Eos: 4 %
Hematocrit: 40.3 % (ref 34.0–46.6)
Hemoglobin: 13.5 g/dL (ref 11.1–15.9)
Immature Grans (Abs): 0 10*3/uL (ref 0.0–0.1)
Immature Granulocytes: 0 %
Lymphocytes Absolute: 1.7 10*3/uL (ref 0.7–3.1)
Lymphs: 44 %
MCH: 29.5 pg (ref 26.6–33.0)
MCHC: 33.5 g/dL (ref 31.5–35.7)
MCV: 88 fL (ref 79–97)
Monocytes Absolute: 0.4 10*3/uL (ref 0.1–0.9)
Monocytes: 9 %
Neutrophils Absolute: 1.7 10*3/uL (ref 1.4–7.0)
Neutrophils: 42 %
Platelets: 258 10*3/uL (ref 150–450)
RBC: 4.57 x10E6/uL (ref 3.77–5.28)
RDW: 12.1 % (ref 11.7–15.4)
WBC: 3.9 10*3/uL (ref 3.4–10.8)

## 2022-10-26 LAB — CMP14+EGFR
ALT: 21 IU/L (ref 0–32)
AST: 21 IU/L (ref 0–40)
Albumin: 4.6 g/dL (ref 3.8–4.9)
Alkaline Phosphatase: 72 IU/L (ref 44–121)
BUN/Creatinine Ratio: 19 (ref 9–23)
BUN: 15 mg/dL (ref 6–24)
Bilirubin Total: 0.4 mg/dL (ref 0.0–1.2)
CO2: 23 mmol/L (ref 20–29)
Calcium: 10.4 mg/dL — ABNORMAL HIGH (ref 8.7–10.2)
Chloride: 101 mmol/L (ref 96–106)
Creatinine, Ser: 0.81 mg/dL (ref 0.57–1.00)
Globulin, Total: 2 g/dL (ref 1.5–4.5)
Glucose: 103 mg/dL — ABNORMAL HIGH (ref 70–99)
Potassium: 4.3 mmol/L (ref 3.5–5.2)
Sodium: 138 mmol/L (ref 134–144)
Total Protein: 6.6 g/dL (ref 6.0–8.5)
eGFR: 86 mL/min/{1.73_m2} (ref >59)

## 2022-10-26 LAB — LIPID PANEL
Chol/HDL Ratio: 2.6 ratio (ref 0.0–4.4)
Cholesterol, Total: 175 mg/dL (ref 100–199)
HDL: 68 mg/dL (ref >39)
LDL Chol Calc (NIH): 82 mg/dL (ref 0–99)
Triglycerides: 150 mg/dL — ABNORMAL HIGH (ref 0–149)
VLDL Cholesterol Cal: 25 mg/dL (ref 5–40)

## 2023-01-31 ENCOUNTER — Other Ambulatory Visit: Payer: Self-pay | Admitting: Family Medicine

## 2023-01-31 DIAGNOSIS — K219 Gastro-esophageal reflux disease without esophagitis: Secondary | ICD-10-CM

## 2023-04-18 ENCOUNTER — Encounter: Payer: BC Managed Care – PPO | Admitting: Family Medicine

## 2023-05-02 ENCOUNTER — Encounter: Payer: Self-pay | Admitting: Family Medicine

## 2023-05-02 ENCOUNTER — Ambulatory Visit (INDEPENDENT_AMBULATORY_CARE_PROVIDER_SITE_OTHER): Payer: BC Managed Care – PPO | Admitting: Family Medicine

## 2023-05-02 ENCOUNTER — Other Ambulatory Visit: Payer: Self-pay | Admitting: Family Medicine

## 2023-05-02 VITALS — BP 115/80 | HR 77 | Temp 98.2°F | Ht 63.0 in | Wt 155.0 lb

## 2023-05-02 DIAGNOSIS — Z0000 Encounter for general adult medical examination without abnormal findings: Secondary | ICD-10-CM | POA: Diagnosis not present

## 2023-05-02 DIAGNOSIS — E782 Mixed hyperlipidemia: Secondary | ICD-10-CM

## 2023-05-02 DIAGNOSIS — I10 Essential (primary) hypertension: Secondary | ICD-10-CM

## 2023-05-02 DIAGNOSIS — K219 Gastro-esophageal reflux disease without esophagitis: Secondary | ICD-10-CM

## 2023-05-02 DIAGNOSIS — Z0001 Encounter for general adult medical examination with abnormal findings: Secondary | ICD-10-CM | POA: Diagnosis not present

## 2023-05-02 MED ORDER — CYCLOBENZAPRINE HCL 5 MG PO TABS: 5.0000 mg | ORAL_TABLET | Freq: Three times a day (TID) | ORAL | 3 refills | Status: DC | PRN

## 2023-05-02 MED ORDER — ATORVASTATIN CALCIUM 20 MG PO TABS: 20.0000 mg | ORAL_TABLET | Freq: Every day | ORAL | 3 refills | Status: DC

## 2023-05-02 MED ORDER — OMEPRAZOLE 20 MG PO CPDR: 20.0000 mg | DELAYED_RELEASE_CAPSULE | Freq: Every day | ORAL | 3 refills | Status: DC

## 2023-05-02 MED ORDER — TRAZODONE HCL 50 MG PO TABS: 50.0000 mg | ORAL_TABLET | Freq: Every evening | ORAL | 3 refills | Status: DC | PRN

## 2023-05-02 MED ORDER — FENOFIBRATE 160 MG PO TABS: 160.0000 mg | ORAL_TABLET | Freq: Every day | ORAL | 3 refills | Status: DC

## 2023-05-02 NOTE — Progress Notes (Signed)
BP 115/80   Pulse 77   Temp 98.2 F (36.8 C)   Ht 5\' 3"  (1.6 m)   Wt 155 lb (70.3 kg)   SpO2 96%   BMI 27.46 kg/m    Subjective:   Patient ID: Alexis [name], female    DOB: 1965-10-05, 57 y.o.   MRN: [id number]  HPI: Alexis [name] is a 57 y.o. female presenting on 05/02/2023 for Annual Exam (w/ pap/)   HPI Physical exam without Pap Patient denies any chest pain, shortness of breath, headaches or vision issues, abdominal complaints, diarrhea, nausea, vomiting, or joint issues.   Hypertension Patient is currently on no medication currently and her blood pressure has been under control diet wise, and their blood pressure today is 115/80. Patient denies any lightheadedness or dizziness. Patient denies headaches, blurred vision, chest pains, shortness of breath, or weakness. Denies any side effects from medication and is content with current medication.   Hyperlipidemia Patient is coming in for recheck of his hyperlipidemia. The patient is currently taking atorvastatin and fenofibrate. They deny any issues with myalgias or history of liver damage from it. They deny any focal numbness or weakness or chest pain.   GERD Patient is currently on omeprazole.  She denies any major symptoms or abdominal pain or belching or burping. She denies any blood in her stool or lightheadedness or dizziness.   She uses trazodone to help with sleep occasionally and that seems to be working.  Denies any major issues.  Relevant past medical, surgical, family and social history reviewed and updated as indicated. Interim medical history since our last visit reviewed. Allergies and medications reviewed and updated.  Review of Systems  Constitutional:  Negative for chills and fever.  HENT:  Negative for congestion, ear discharge, ear pain and tinnitus.   Eyes:  Negative for pain, redness and visual disturbance.  Respiratory:  Negative for cough, chest tightness, shortness of breath and wheezing.    Cardiovascular:  Negative for chest pain, palpitations and leg swelling.  Gastrointestinal:  Negative for abdominal pain, blood in stool, constipation and diarrhea.  Genitourinary:  Negative for difficulty urinating, dysuria and hematuria.  Musculoskeletal:  Negative for back pain, gait problem and myalgias.  Skin:  Negative for rash.  Neurological:  Negative for dizziness, weakness, light-headedness and headaches.  Psychiatric/Behavioral:  Negative for agitation, behavioral problems and suicidal ideas.   All other systems reviewed and are negative.   Per HPI unless specifically indicated above   Allergies as of 05/02/2023       Reactions   Azithromycin Hives   After a few days   Codeine Hives   Penicillins Hives   Has patient had a PCN reaction causing immediate rash, facial/tongue/throat swelling, SOB or lightheadedness with hypotension: NO Has patient had a PCN reaction causing severe rash involving mucus membranes or skin necrosis: No Has patient had a PCN reaction that required hospitalization: No Has patient had a PCN reaction occurring within the last 10 years: No If all of the above answers are "NO", then may proceed with Cephalosporin use.   Amlodipine Swelling   Pedal edema   Tetracyclines & Related Nausea And Vomiting   GI distress   Tramadol Itching   Side effect        Medication List        Accurate as of May 02, 2023  3:59 PM. If you have any questions, ask your nurse or doctor.          atorvastatin 20  MG tablet Commonly known as: LIPITOR Take 1 tablet (20 mg total) by mouth at bedtime.   cyclobenzaprine 5 MG tablet Commonly known as: FLEXERIL Take 1 tablet (5 mg total) by mouth 3 (three) times daily as needed for muscle spasms. What changed: See the new instructions. Changed by: Elige Radon Kalysta Kneisley   fenofibrate 160 MG tablet Take 1 tablet (160 mg total) by mouth daily.   fexofenadine 180 MG tablet Commonly known as: ALLEGRA Take 180  mg by mouth daily as needed for allergies.   omeprazole 20 MG capsule Commonly known as: PRILOSEC Take 1 capsule (20 mg total) by mouth daily.   traZODone 50 MG tablet Commonly known as: DESYREL Take 1 tablet (50 mg total) by mouth at bedtime as needed for sleep.         Objective:   BP 115/80   Pulse 77   Temp 98.2 F (36.8 C)   Ht 5\' 3"  (1.6 m)   Wt 155 lb (70.3 kg)   SpO2 96%   BMI 27.46 kg/m   Wt Readings from Last 3 Encounters:  05/02/23 155 lb (70.3 kg)  10/25/22 155 lb (70.3 kg)  04/22/22 154 lb (69.9 kg)    Physical Exam Vitals and nursing note reviewed.  Constitutional:      General: She is not in acute distress.    Appearance: Normal appearance. She is well-developed. She is not diaphoretic.  HENT:     Right Ear: Tympanic membrane and ear canal normal.     Left Ear: Tympanic membrane and ear canal normal.     Mouth/Throat:     Mouth: Mucous membranes are moist.     Pharynx: Oropharynx is clear. No oropharyngeal exudate or posterior oropharyngeal erythema.  Eyes:     Conjunctiva/sclera: Conjunctivae normal.  Cardiovascular:     Rate and Rhythm: Normal rate and regular rhythm.     Heart sounds: Normal heart sounds. No murmur heard. Pulmonary:     Effort: Pulmonary effort is normal. No respiratory distress.     Breath sounds: Normal breath sounds. No wheezing or rales.  Abdominal:     General: Abdomen is flat. Bowel sounds are normal. There is no distension.     Palpations: Abdomen is soft.     Tenderness: There is no abdominal tenderness. There is no guarding or rebound.  Musculoskeletal:        General: No swelling or tenderness. Normal range of motion.  Skin:    General: Skin is warm and dry.     Findings: No rash.  Neurological:     Mental Status: She is alert and oriented to person, place, and time.     Coordination: Coordination normal.  Psychiatric:        Behavior: Behavior normal.       Assessment & Plan:   Problem List Items  Addressed This Visit       Cardiovascular and Mediastinum   Essential hypertension   Relevant Medications   atorvastatin (LIPITOR) 20 MG tablet   fenofibrate 160 MG tablet   Other Relevant Orders   CBC with Differential/Platelet   CMP14+EGFR   Lipid panel     Digestive   GERD (gastroesophageal reflux disease)   Relevant Medications   omeprazole (PRILOSEC) 20 MG capsule   Other Relevant Orders   CBC with Differential/Platelet     Other   Mixed hyperlipidemia   Relevant Medications   atorvastatin (LIPITOR) 20 MG tablet   fenofibrate 160 MG tablet   Other  Relevant Orders   CBC with Differential/Platelet   CMP14+EGFR   Lipid panel   Other Visit Diagnoses       Physical exam    -  Primary   Relevant Orders   CBC with Differential/Platelet   CMP14+EGFR   Lipid panel     Patient seems to be doing well, she did donate blood a couple days ago at [facility] so that may affect her hemoglobin and show anemia but everything else should be able to be tested. Cholesterol and everything else will be checked today and she seems to be doing okay and continue current medicine.  She does have some stress related to caregiving for her husband's health issues and is managing that with a close friend that she can talk to. Follow up plan: Return in about 6 months (around 10/31/2023), or if symptoms worsen or fail to improve, for Hypertension cholesterol recheck.  Counseling provided for all of the vaccine components Orders Placed This Encounter  Procedures   CBC with Differential/Platelet   CMP14+EGFR   Lipid panel    Arville Care, MD [facility] 05/02/2023, 3:59 PM

## 2023-05-03 LAB — CMP14+EGFR
ALT: 29 [IU]/L (ref 0–32)
AST: 25 [IU]/L (ref 0–40)
Albumin: 4.7 g/dL (ref 3.8–4.9)
Alkaline Phosphatase: 76 [IU]/L (ref 44–121)
BUN/Creatinine Ratio: 11 (ref 9–23)
BUN: 9 mg/dL (ref 6–24)
Bilirubin Total: <0.2 mg/dL (ref 0.0–1.2)
CO2: 23 mmol/L (ref 20–29)
Calcium: 9.9 mg/dL (ref 8.7–10.2)
Chloride: 104 mmol/L (ref 96–106)
Creatinine, Ser: 0.85 mg/dL (ref 0.57–1.00)
Globulin, Total: 1.8 g/dL (ref 1.5–4.5)
Glucose: 97 mg/dL (ref 70–99)
Potassium: 4 mmol/L (ref 3.5–5.2)
Sodium: 143 mmol/L (ref 134–144)
Total Protein: 6.5 g/dL (ref 6.0–8.5)
eGFR: 80 mL/min/{1.73_m2} (ref >59)

## 2023-05-03 LAB — LIPID PANEL
Chol/HDL Ratio: 3.3 {ratio} (ref 0.0–4.4)
Cholesterol, Total: 177 mg/dL (ref 100–199)
HDL: 53 mg/dL (ref >39)
LDL Chol Calc (NIH): 95 mg/dL (ref 0–99)
Triglycerides: 170 mg/dL — ABNORMAL HIGH (ref 0–149)
VLDL Cholesterol Cal: 29 mg/dL (ref 5–40)

## 2023-05-03 LAB — CBC WITH DIFFERENTIAL/PLATELET
Basophils Absolute: 0 10*3/uL (ref 0.0–0.2)
Basos: 1 %
EOS (ABSOLUTE): 0.2 10*3/uL (ref 0.0–0.4)
Eos: 3 %
Hematocrit: 38.4 % (ref 34.0–46.6)
Hemoglobin: 12.6 g/dL (ref 11.1–15.9)
Immature Grans (Abs): 0 10*3/uL (ref 0.0–0.1)
Immature Granulocytes: 0 %
Lymphocytes Absolute: 2.7 10*3/uL (ref 0.7–3.1)
Lymphs: 50 %
MCH: 29.5 pg (ref 26.6–33.0)
MCHC: 32.8 g/dL (ref 31.5–35.7)
MCV: 90 fL (ref 79–97)
Monocytes Absolute: 0.3 10*3/uL (ref 0.1–0.9)
Monocytes: 6 %
Neutrophils Absolute: 2.2 10*3/uL (ref 1.4–7.0)
Neutrophils: 40 %
Platelets: 266 10*3/uL (ref 150–450)
RBC: 4.27 x10E6/uL (ref 3.77–5.28)
RDW: 12.6 % (ref 11.7–15.4)
WBC: 5.4 10*3/uL (ref 3.4–10.8)

## 2023-06-06 ENCOUNTER — Other Ambulatory Visit: Payer: Self-pay | Admitting: Family Medicine

## 2023-06-06 DIAGNOSIS — Z1231 Encounter for screening mammogram for malignant neoplasm of breast: Secondary | ICD-10-CM

## 2023-06-13 ENCOUNTER — Ambulatory Visit
Admission: RE | Admit: 2023-06-13 | Discharge: 2023-06-13 | Disposition: A | Discharge status: Home / Self Care | Payer: BC Managed Care – PPO | Source: Ambulatory Visit | Attending: Family Medicine

## 2023-06-13 DIAGNOSIS — Z1231 Encounter for screening mammogram for malignant neoplasm of breast: Secondary | ICD-10-CM

## 2023-08-05 IMAGING — MG MM DIGITAL SCREENING BILAT W/ TOMO AND CAD
8 series · 9 of 24 positions shown · non-contrast
Comparison: Previous exam(s).

CLINICAL DATA: Screening.

EXAM:
DIGITAL SCREENING BILATERAL MAMMOGRAM WITH TOMOSYNTHESIS AND CAD
TECHNIQUE: Bilateral screening digital craniocaudal and mediolateral oblique
mammograms were obtained. Bilateral screening digital breast
tomosynthesis was performed. The images were evaluated with
computer-aided detection.

[L MLO synth-2D]
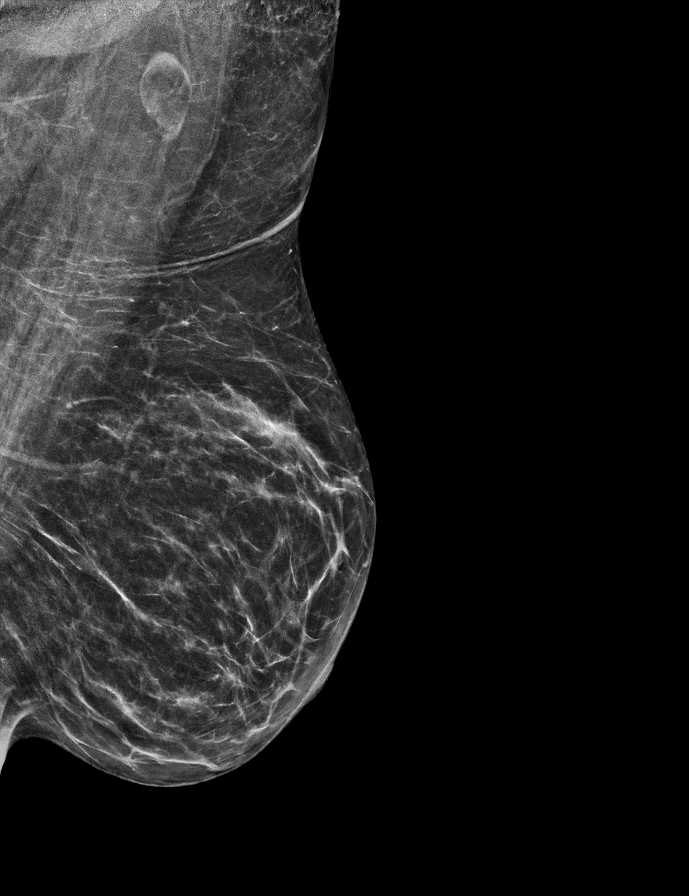

[R CC synth-2D]
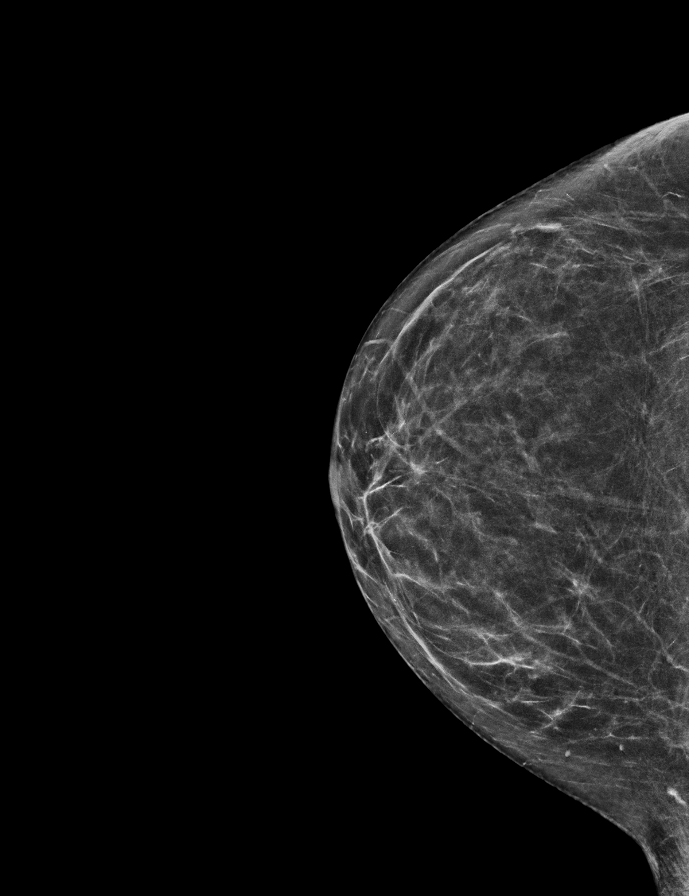

[L CC synth-2D]
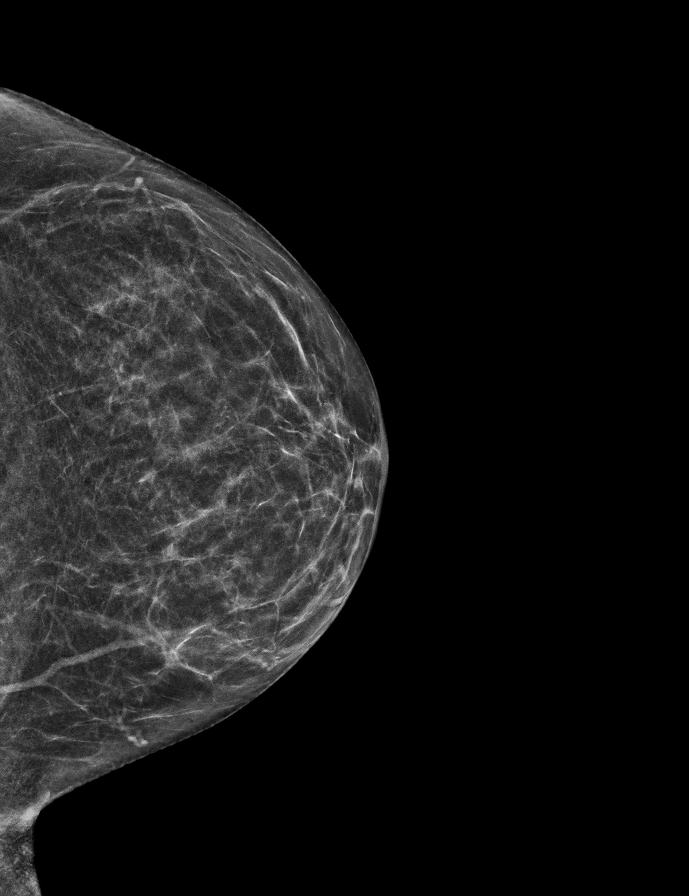

[R MLO synth-2D]
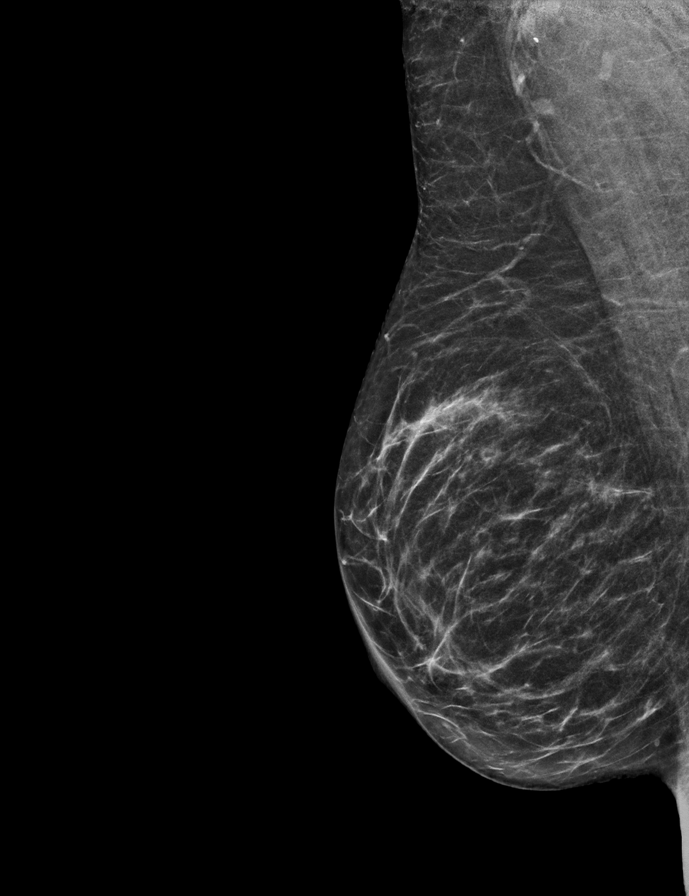

[L CC tomo · 2 of 44 frames shown]
[frame 15/44]
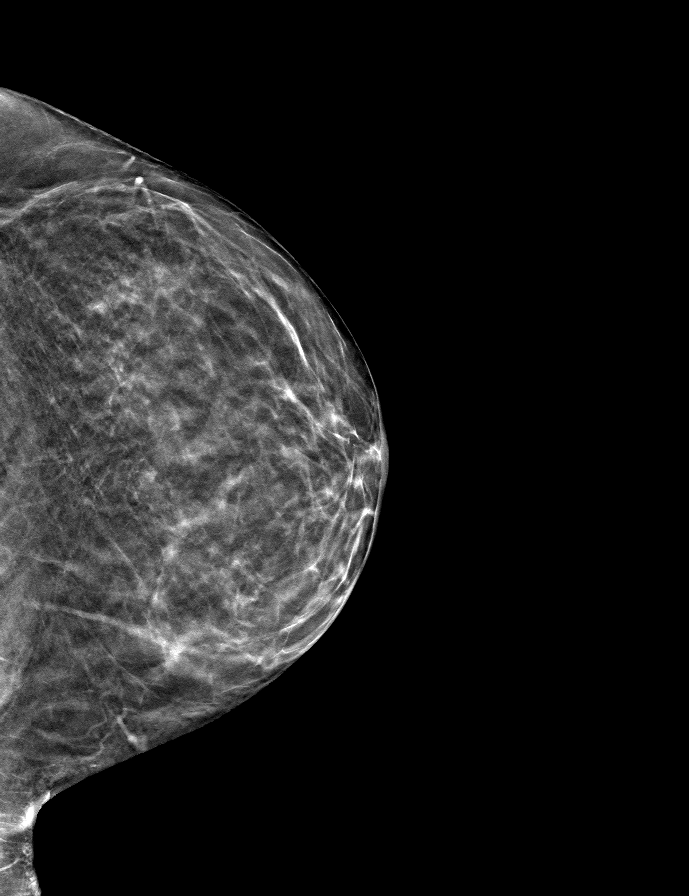
[frame 23/44]
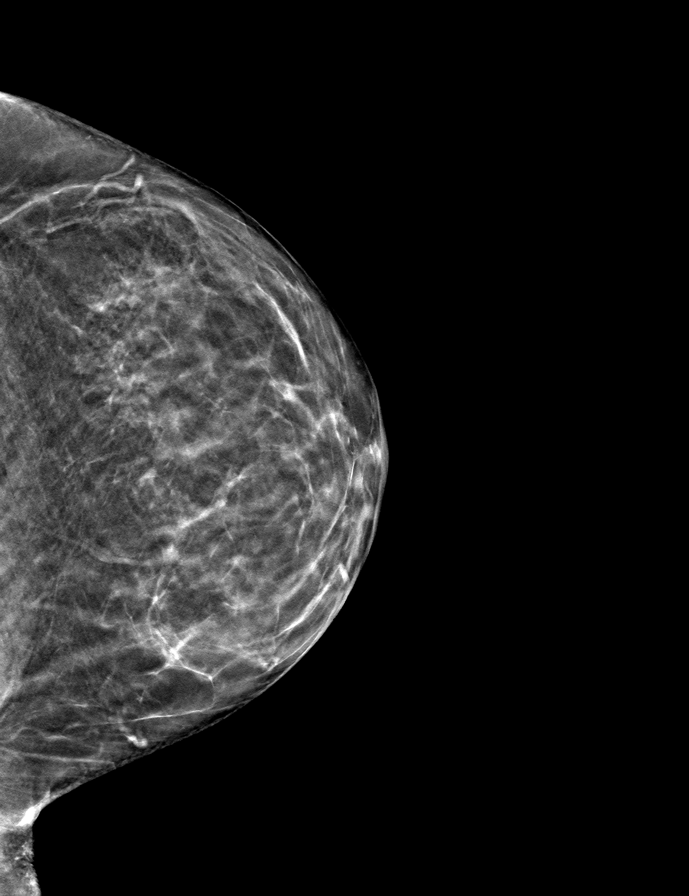

[R CC tomo · tomo slice 27/52.0]
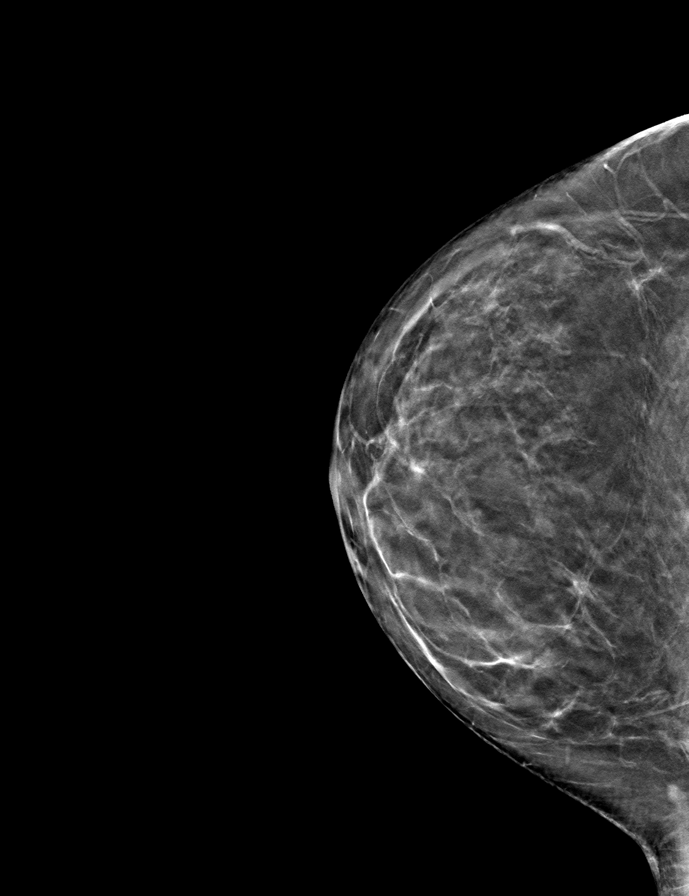

[L MLO tomo · tomo slice 29/58.0]
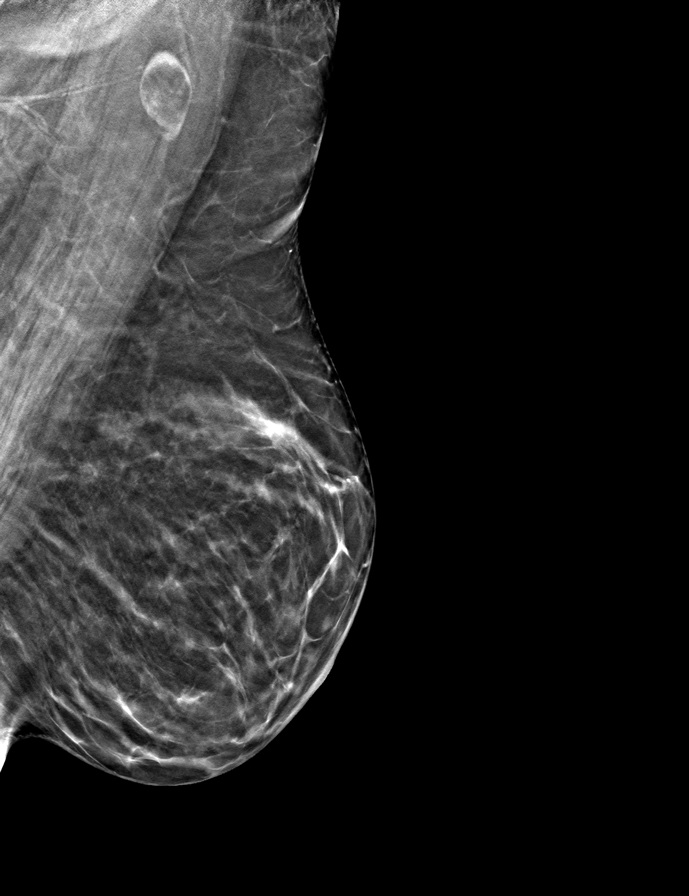

[R MLO tomo · tomo slice 27/52.0]
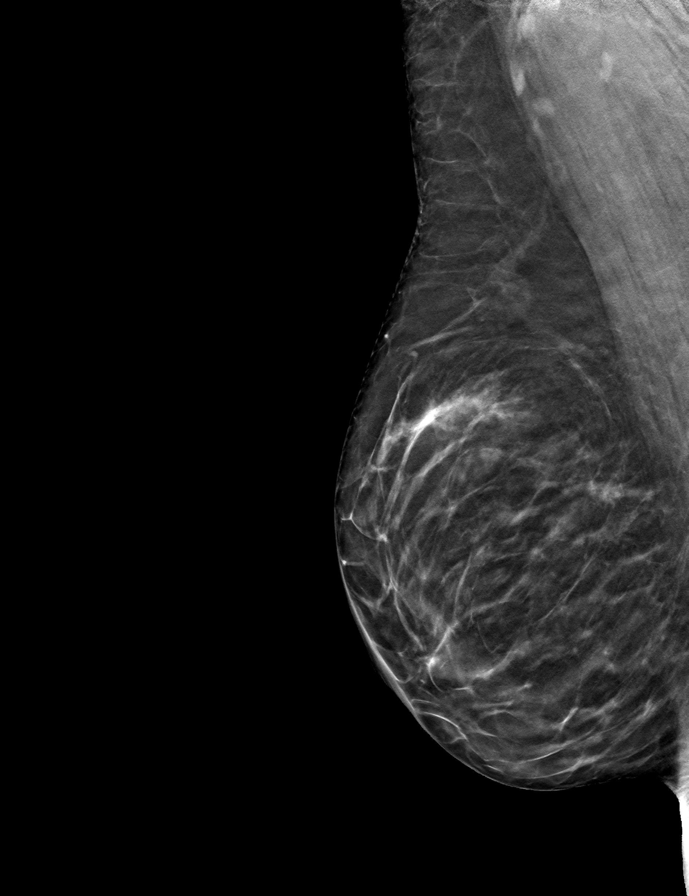

[9 of 24 positions shown; findings below may reference images not displayed]

ACR Breast Density Category b: There are scattered areas of
fibroglandular density.
FINDINGS: There are no findings suspicious for malignancy.
IMPRESSION: No mammographic evidence of malignancy. A result letter of this
screening mammogram will be mailed directly to the patient.

RECOMMENDATION:
Screening mammogram in one year. (Code:51-O-LD2)

BI-RADS CATEGORY  1: Negative.

## 2023-10-31 ENCOUNTER — Ambulatory Visit: Payer: BC Managed Care – PPO | Admitting: Family Medicine

## 2023-11-02 ENCOUNTER — Encounter: Payer: Self-pay | Admitting: Family Medicine

## 2023-11-02 ENCOUNTER — Ambulatory Visit (INDEPENDENT_AMBULATORY_CARE_PROVIDER_SITE_OTHER): Payer: BC Managed Care – PPO | Admitting: Family Medicine

## 2023-11-02 VITALS — BP 139/86 | HR 86 | Ht 63.0 in | Wt 158.0 lb

## 2023-11-02 DIAGNOSIS — I10 Essential (primary) hypertension: Secondary | ICD-10-CM

## 2023-11-02 DIAGNOSIS — K219 Gastro-esophageal reflux disease without esophagitis: Secondary | ICD-10-CM | POA: Diagnosis not present

## 2023-11-02 DIAGNOSIS — E782 Mixed hyperlipidemia: Secondary | ICD-10-CM | POA: Diagnosis not present

## 2023-11-02 LAB — LIPID PANEL

## 2023-11-02 NOTE — Progress Notes (Signed)
 BP 139/86   Pulse 86   Ht 5' 3 (1.6 m)   Wt 158 lb (71.7 kg)   SpO2 96%   BMI 27.99 kg/m    Subjective:   Patient ID: Alexis [name], female    DOB: [birth year], 58 y.o.   MRN: [id number]  HPI: Alexis [name] is a 58 y.o. female presenting on 11/02/2023 for Medical Management of Chronic Issues, Hyperlipidemia, and Hypertension   HPI Hypertension Patient is currently on no medicine currently, and their blood pressure today is 139/86. Patient denies any lightheadedness or dizziness. Patient denies headaches, blurred vision, chest pains, shortness of breath, or weakness. Denies any side effects from medication and is content with current medication.   Hyperlipidemia Patient is coming in for recheck of his hyperlipidemia. The patient is currently taking atorvastatin  and fenofibrate . They deny any issues with myalgias or history of liver damage from it. They deny any focal numbness or weakness or chest pain.   GERD Patient is currently on omeprazole .  She denies any major symptoms or abdominal pain or belching or burping. She denies any blood in her stool or lightheadedness or dizziness.   Relevant past medical, surgical, family and social history reviewed and updated as indicated. Interim medical history since our last visit reviewed. Allergies and medications reviewed and updated.  Review of Systems  Constitutional:  Negative for chills and fever.  Eyes:  Negative for visual disturbance.  Respiratory:  Negative for chest tightness and shortness of breath.   Cardiovascular:  Negative for chest pain and leg swelling.  Genitourinary:  Negative for difficulty urinating and dysuria.  Musculoskeletal:  Negative for back pain and gait problem.  Skin:  Negative for rash.  Neurological:  Negative for dizziness, light-headedness and headaches.  Psychiatric/Behavioral:  Negative for agitation and behavioral problems.   All other systems reviewed and are negative.   Per HPI unless  specifically indicated above   Allergies as of 11/02/2023       Reactions   Azithromycin Hives   After a few days   Codeine Hives   Penicillins Hives   Has patient had a PCN reaction causing immediate rash, facial/tongue/throat swelling, SOB or lightheadedness with hypotension: NO Has patient had a PCN reaction causing severe rash involving mucus membranes or skin necrosis: No Has patient had a PCN reaction that required hospitalization: No Has patient had a PCN reaction occurring within the last 10 years: No If all of the above answers are NO, then may proceed with Cephalosporin use.   Amlodipine  Swelling   Pedal edema   Tetracyclines & Related Nausea And Vomiting   GI distress   Tramadol Itching   Side effect        Medication List        Accurate as of November 02, 2023  8:18 AM. If you have any questions, ask your nurse or doctor.          atorvastatin  20 MG tablet Commonly known as: LIPITOR Take 1 tablet (20 mg total) by mouth at bedtime.   cyclobenzaprine  5 MG tablet Commonly known as: FLEXERIL  Take 1 tablet (5 mg total) by mouth 3 (three) times daily as needed for muscle spasms.   fenofibrate  160 MG tablet Take 1 tablet (160 mg total) by mouth daily.   fexofenadine 180 MG tablet Commonly known as: ALLEGRA Take 180 mg by mouth daily as needed for allergies.   omeprazole  20 MG capsule Commonly known as: PRILOSEC Take 1 capsule (20 mg total) by mouth  daily.   traZODone  50 MG tablet Commonly known as: DESYREL  Take 1 tablet (50 mg total) by mouth at bedtime as needed for sleep.         Objective:   BP 139/86   Pulse 86   Ht 5' 3 (1.6 m)   Wt 158 lb (71.7 kg)   SpO2 96%   BMI 27.99 kg/m   Wt Readings from Last 3 Encounters:  11/02/23 158 lb (71.7 kg)  05/02/23 155 lb (70.3 kg)  10/25/22 155 lb (70.3 kg)    Physical Exam Vitals and nursing note reviewed.  Constitutional:      General: She is not in acute distress.    Appearance: She is  well-developed. She is not diaphoretic.  Eyes:     Conjunctiva/sclera: Conjunctivae normal.  Cardiovascular:     Rate and Rhythm: Normal rate and regular rhythm.     Heart sounds: Normal heart sounds. No murmur heard. Pulmonary:     Effort: Pulmonary effort is normal. No respiratory distress.     Breath sounds: Normal breath sounds. No wheezing.  Musculoskeletal:        General: Normal range of motion.  Skin:    General: Skin is warm and dry.     Findings: No rash.  Neurological:     Mental Status: She is alert and oriented to person, place, and time.     Coordination: Coordination normal.  Psychiatric:        Behavior: Behavior normal.       Assessment & Plan:   Problem List Items Addressed This Visit       Cardiovascular and Mediastinum   Essential hypertension - Primary     Digestive   GERD (gastroesophageal reflux disease)     Other   Mixed hyperlipidemia    Instruct patient to keep current blood pressure at home whenever she goes into a pharmacy or other things like that.  It is borderline today I do not want to start medicine back on her at this point.  Continue allergy medicine as currently. Follow up plan: Return in about 6 months (around 05/04/2024), or if symptoms worsen or fail to improve, for Physical exam and recheck cholesterol and blood pressure.  Counseling provided for all of the vaccine components No orders of the defined types were placed in this encounter.   Fonda Levins, MD [facility] 11/02/2023, 8:18 AM

## 2023-11-03 ENCOUNTER — Ambulatory Visit: Payer: Self-pay | Admitting: Family Medicine

## 2023-11-03 LAB — CBC WITH DIFFERENTIAL/PLATELET
Basophils Absolute: 0 10*3/uL (ref 0.0–0.2)
Basos: 1 %
EOS (ABSOLUTE): 0.2 10*3/uL (ref 0.0–0.4)
Eos: 5 %
Hematocrit: 42.9 % (ref 34.0–46.6)
Hemoglobin: 13.9 g/dL (ref 11.1–15.9)
Immature Grans (Abs): 0 10*3/uL (ref 0.0–0.1)
Immature Granulocytes: 0 %
Lymphocytes Absolute: 1.8 10*3/uL (ref 0.7–3.1)
Lymphs: 48 %
MCH: 29.6 pg (ref 26.6–33.0)
MCHC: 32.4 g/dL (ref 31.5–35.7)
MCV: 91 fL (ref 79–97)
Monocytes Absolute: 0.3 10*3/uL (ref 0.1–0.9)
Monocytes: 8 %
Neutrophils Absolute: 1.4 10*3/uL (ref 1.4–7.0)
Neutrophils: 38 %
Platelets: 250 10*3/uL (ref 150–450)
RBC: 4.7 x10E6/uL (ref 3.77–5.28)
RDW: 12.5 % (ref 11.7–15.4)
WBC: 3.7 10*3/uL (ref 3.4–10.8)

## 2023-11-03 LAB — CMP14+EGFR
ALT: 43 IU/L — AB (ref 0–32)
AST: 31 IU/L (ref 0–40)
Albumin: 4.7 g/dL (ref 3.8–4.9)
Alkaline Phosphatase: 76 IU/L (ref 44–121)
BUN/Creatinine Ratio: 12 (ref 9–23)
BUN: 9 mg/dL (ref 6–24)
Bilirubin Total: 0.3 mg/dL (ref 0.0–1.2)
CO2: 22 mmol/L (ref 20–29)
Calcium: 10.2 mg/dL (ref 8.7–10.2)
Chloride: 101 mmol/L (ref 96–106)
Creatinine, Ser: 0.76 mg/dL (ref 0.57–1.00)
Globulin, Total: 1.9 g/dL (ref 1.5–4.5)
Glucose: 101 mg/dL — ABNORMAL HIGH (ref 70–99)
Potassium: 4.2 mmol/L (ref 3.5–5.2)
Sodium: 140 mmol/L (ref 134–144)
Total Protein: 6.6 g/dL (ref 6.0–8.5)
eGFR: 92 mL/min/{1.73_m2} (ref >59)

## 2023-11-03 LAB — LIPID PANEL
Cholesterol, Total: 174 mg/dL (ref 100–199)
HDL: 53 mg/dL (ref >39)
LDL CALC COMMENT:: 3.3 ratio (ref 0.0–4.4)
LDL Chol Calc (NIH): 92 mg/dL (ref 0–99)
Triglycerides: 166 mg/dL — AB (ref 0–149)
VLDL Cholesterol Cal: 29 mg/dL (ref 5–40)

## 2024-01-17 ENCOUNTER — Telehealth: Payer: Self-pay

## 2024-01-17 NOTE — Telephone Encounter (Signed)
 Copied from CRM [id number]. Topic: Clinical - Medication Question >> Jan 17, 2024  1:32 PM Alexis [name] wrote: Reason for CRM: patient called to speak with nurse to let her know how the medication traZODone  (DESYREL ) 50 MG tablet  is working for her. Please f/u with patient

## 2024-01-18 MED ORDER — TRAZODONE HCL 50 MG PO TABS: 50.0000 mg | ORAL_TABLET | Freq: Every day | ORAL | 1 refills | Status: DC

## 2024-01-18 NOTE — Telephone Encounter (Addendum)
 Currently taking 1.5tabs prn. Pt has ran out of the 30d supply since original Rx was for 1 nightly.  Usually takes 1 per night but occasionally needs the 1/2.  Please send in new Rx for 1.5 tabs at bedtime prn

## 2024-01-18 NOTE — Telephone Encounter (Signed)
 Please call her and let me know what she says about the trazodone  and how it is working for her?

## 2024-01-18 NOTE — Addendum Note (Signed)
 Addended by: LEIGH ROSINA SAILOR on: 01/18/2024 08:40 AM   Modules accepted: Orders

## 2024-05-07 ENCOUNTER — Encounter: Payer: Self-pay | Admitting: Family Medicine

## 2024-05-07 ENCOUNTER — Other Ambulatory Visit (HOSPITAL_COMMUNITY)
Admission: RE | Admit: 2024-05-07 | Discharge: 2024-05-07 | Disposition: A | Discharge status: Home / Self Care | Source: Ambulatory Visit | Attending: Family Medicine | Admitting: Family Medicine

## 2024-05-07 ENCOUNTER — Ambulatory Visit (INDEPENDENT_AMBULATORY_CARE_PROVIDER_SITE_OTHER): Payer: Self-pay | Admitting: Family Medicine

## 2024-05-07 VITALS — BP 140/93 | HR 81 | Temp 98.0°F | Ht 63.0 in | Wt 161.0 lb

## 2024-05-07 DIAGNOSIS — Z0000 Encounter for general adult medical examination without abnormal findings: Secondary | ICD-10-CM | POA: Diagnosis not present

## 2024-05-07 DIAGNOSIS — I10 Essential (primary) hypertension: Secondary | ICD-10-CM

## 2024-05-07 DIAGNOSIS — R42 Dizziness and giddiness: Secondary | ICD-10-CM | POA: Diagnosis not present

## 2024-05-07 DIAGNOSIS — K219 Gastro-esophageal reflux disease without esophagitis: Secondary | ICD-10-CM

## 2024-05-07 DIAGNOSIS — E782 Mixed hyperlipidemia: Secondary | ICD-10-CM | POA: Diagnosis not present

## 2024-05-07 DIAGNOSIS — J3489 Other specified disorders of nose and nasal sinuses: Secondary | ICD-10-CM | POA: Diagnosis not present

## 2024-05-07 MED ORDER — ATORVASTATIN CALCIUM 20 MG PO TABS: 20.0000 mg | ORAL_TABLET | Freq: Every day | ORAL | 3 refills | Status: AC

## 2024-05-07 MED ORDER — OMEPRAZOLE 20 MG PO CPDR: 20.0000 mg | DELAYED_RELEASE_CAPSULE | Freq: Every day | ORAL | 3 refills | Status: AC

## 2024-05-07 MED ORDER — CYCLOBENZAPRINE HCL 5 MG PO TABS: 5.0000 mg | ORAL_TABLET | Freq: Three times a day (TID) | ORAL | 3 refills | Status: AC | PRN

## 2024-05-07 MED ORDER — TRAZODONE HCL 50 MG PO TABS: 50.0000 mg | ORAL_TABLET | Freq: Every day | ORAL | 3 refills | Status: AC

## 2024-05-07 MED ORDER — FENOFIBRATE 160 MG PO TABS: 160.0000 mg | ORAL_TABLET | Freq: Every day | ORAL | 3 refills | Status: AC

## 2024-05-07 NOTE — Progress Notes (Signed)
 "  BP (!) 140/93   Pulse 81   Temp 98 F (36.7 C)   Ht 5' 3 (1.6 m)   Wt 161 lb (73 kg)   SpO2 98%   BMI 28.52 kg/m    Subjective:   Patient ID: Alexis [name], female    DOB: 24-Aug-1965, 59 y.o.   MRN: [id number]  HPI: Alexis [name] is a 59 y.o. female presenting on 05/07/2024 for Medical Management of Chronic Issues (With pap)   Discussed the use of AI scribe software for clinical note transcription with the patient, who gave verbal consent to proceed.  History of Present Illness   Alexis [name] is a 59 year old female who presents for an annual physical exam and checkup.  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 - Managing husband's activity to prevent overexertion - Feels able to manage current stress without additional help  Hyperlipidemia management - Currently taking Lipitor and fenofibrate  - Tolerating medications well without adverse effects  Gastroesophageal reflux disease - Taking omeprazole  daily for approximately thirty years - Experiences reflux symptoms if a dose is missed  Sleep disturbance - Uses trazodone  as needed for sleep - Typically takes one to one and a half tablets - Finds trazodone  effective for sleep  Allergic rhinitis - Takes an allergy pill regularly - Symptoms well controlled with current regimen          Relevant past medical, surgical, family and social history reviewed and updated as indicated. Interim medical history since our last visit reviewed. Allergies and medications reviewed and  updated.  Review of Systems  Constitutional:  Negative for chills and fever.  HENT:  Positive for sinus pressure. Negative for congestion, ear discharge and ear pain.   Eyes:  Negative for redness and visual disturbance.  Respiratory:  Negative for chest tightness and shortness of breath.   Cardiovascular:  Negative for chest pain and leg swelling.  Genitourinary:  Negative for difficulty urinating and dysuria.  Skin:  Negative for rash.  Neurological:  Positive for dizziness. Negative for light-headedness and headaches.  Psychiatric/Behavioral:  Positive for sleep disturbance. Negative for agitation, behavioral problems, dysphoric mood, self-injury and suicidal ideas. The patient is nervous/anxious.   All other systems reviewed and are negative.   Per HPI unless specifically indicated above   Allergies as of 05/07/2024       Reactions   Azithromycin Hives   After a few days   Codeine Hives   Penicillins Hives   Has patient had a PCN reaction causing immediate rash, facial/tongue/throat swelling, SOB or lightheadedness with hypotension: NO Has patient had a PCN reaction causing severe rash involving mucus membranes or skin necrosis: No Has patient had a PCN reaction that required hospitalization: No Has patient had a PCN reaction occurring within the last 10 years: No If all of the above answers are NO, then may proceed with Cephalosporin use.   Amlodipine  Swelling   Pedal edema   Tetracyclines & Related Nausea And Vomiting   GI distress   Tramadol Itching   Side effect        Medication List  Accurate as of May 07, 2024  8:35 AM. If you have any questions, ask your nurse or doctor.          atorvastatin  20 MG tablet Commonly known as: LIPITOR Take 1 tablet (20 mg total) by mouth at bedtime.   cyclobenzaprine  5 MG tablet Commonly known as: FLEXERIL  Take 1 tablet (5 mg total) by mouth 3 (three) times daily as needed for muscle spasms.   fenofibrate   160 MG tablet Take 1 tablet (160 mg total) by mouth daily.   fexofenadine 180 MG tablet Commonly known as: ALLEGRA Take 180 mg by mouth daily as needed for allergies.   omeprazole  20 MG capsule Commonly known as: PRILOSEC Take 1 capsule (20 mg total) by mouth daily.   traZODone  50 MG tablet Commonly known as: DESYREL  Take 1-1.5 tablets (50-75 mg total) by mouth at bedtime.         Objective:   BP (!) 140/93   Pulse 81   Temp 98 F (36.7 C)   Ht 5' 3 (1.6 m)   Wt 161 lb (73 kg)   SpO2 98%   BMI 28.52 kg/m   Wt Readings from Last 3 Encounters:  05/07/24 161 lb (73 kg)  11/02/23 158 lb (71.7 kg)  05/02/23 155 lb (70.3 kg)    Physical Exam Vitals reviewed.  Constitutional:      General: She is not in acute distress.    Appearance: She is well-developed. She is not diaphoretic.  Eyes:     Conjunctiva/sclera: Conjunctivae normal.  Neck:     Thyroid: No thyromegaly.  Cardiovascular:     Rate and Rhythm: Normal rate and regular rhythm.     Heart sounds: Normal heart sounds. No murmur heard. Pulmonary:     Effort: Pulmonary effort is normal. No respiratory distress.     Breath sounds: Normal breath sounds. No wheezing.  Chest:  Breasts:    Breasts are symmetrical.     Right: No inverted nipple, mass, nipple discharge, skin change or tenderness.     Left: No inverted nipple, mass, nipple discharge, skin change or tenderness.  Abdominal:     General: Bowel sounds are normal. There is no distension.     Palpations: Abdomen is soft.     Tenderness: There is no abdominal tenderness. There is no guarding or rebound.  Genitourinary:    Exam position: Supine.     Labia:        Right: No rash or lesion.        Left: No rash or lesion.      Vagina: Normal.     Cervix: No cervical motion tenderness, discharge or friability.     Uterus: Not deviated, not enlarged, not fixed and not tender.      Adnexa:        Right: No mass or tenderness.         Left: No mass or  tenderness.    Musculoskeletal:     Cervical back: Neck supple.  Lymphadenopathy:     Cervical: No cervical adenopathy.  Skin:    General: Skin is warm and dry.     Findings: No rash.  Neurological:     Mental Status: She is alert and oriented to person, place, and time.     Coordination: Coordination normal.  Psychiatric:        Behavior: Behavior normal.    Physical Exam   CHEST: Lungs clear to auscultation. CARDIOVASCULAR: Regular heart sounds. BREAST: No lumps or  nodules in breasts bilaterally.         Assessment & Plan:   Problem List Items Addressed This Visit       Cardiovascular and Mediastinum   Essential hypertension   Relevant Medications   atorvastatin  (LIPITOR) 20 MG tablet   fenofibrate  160 MG tablet     Digestive   GERD (gastroesophageal reflux disease)   Relevant Medications   omeprazole  (PRILOSEC) 20 MG capsule   Other Relevant Orders   Ambulatory referral to Gastroenterology     Other   Mixed hyperlipidemia   Relevant Medications   atorvastatin  (LIPITOR) 20 MG tablet   fenofibrate  160 MG tablet   Other Visit Diagnoses       Physical exam    -  Primary   Relevant Orders   CBC with Differential/Platelet   CMP14+EGFR   Lipid panel   TSH   Ambulatory referral to Gastroenterology   MM 3D SCREENING MAMMOGRAM BILATERAL BREAST   Cytology - PAP([facility])     Sinus pressure         Dizziness             Woman's Wellness Visit Routine wellness visit. Discussed stress management and lifestyle modifications. - Continue current medications: Lipitor, fenofibrate , omeprazole , trazodone , and allergy medication. - Encouraged weight-bearing exercise to maintain bone density. - Encouraged self-breast exams every six months. - Ordered blood work to check cholesterol levels. - Encouraged calcium  and vitamin D intake to support bone health.  Vertigo possibly related to sinusitis Vertigo symptoms for three days, possibly related to sinus pressure.  Differential includes sinusitis and inner ear issues. - Start Benadryl nightly for sinus relief. - Use Dramamine for motion sickness symptoms. - Consider using Flonase nasal spray for sinus congestion.  Mixed hyperlipidemia Managed with Lipitor and fenofibrate . - Ordered blood work to recheck cholesterol levels.  Gastroesophageal reflux disease Long-term management with omeprazole . Discussed risks of long-term use, including potential impact on bone density. Emphasized importance of daily intake due to immediate symptom recurrence upon missed doses. - Continue omeprazole  daily. - Encouraged calcium  and vitamin D intake to support bone health.          Follow up plan: Return in about 6 months (around 11/04/2024), or if symptoms worsen or fail to improve, for Hyperlipidemia and GERD recheck.  Counseling provided for all of the vaccine components Orders Placed This Encounter  Procedures   MM 3D SCREENING MAMMOGRAM BILATERAL BREAST   CBC with Differential/Platelet   CMP14+EGFR   Lipid panel   TSH   Ambulatory referral to Gastroenterology    Fonda Levins, MD [facility] 05/07/2024, 8:35 AM     "

## 2024-05-08 LAB — CBC WITH DIFFERENTIAL/PLATELET
Basophils Absolute: 0 x10E3/uL (ref 0.0–0.2)
Basos: 1 %
EOS (ABSOLUTE): 0.3 x10E3/uL (ref 0.0–0.4)
Eos: 5 %
Hematocrit: 45.8 % (ref 34.0–46.6)
Hemoglobin: 14.9 g/dL (ref 11.1–15.9)
Immature Grans (Abs): 0 x10E3/uL (ref 0.0–0.1)
Immature Granulocytes: 0 %
Lymphocytes Absolute: 1.9 x10E3/uL (ref 0.7–3.1)
Lymphs: 38 %
MCH: 29.2 pg (ref 26.6–33.0)
MCHC: 32.5 g/dL (ref 31.5–35.7)
MCV: 90 fL (ref 79–97)
Monocytes Absolute: 0.4 x10E3/uL (ref 0.1–0.9)
Monocytes: 8 %
Neutrophils Absolute: 2.4 x10E3/uL (ref 1.4–7.0)
Neutrophils: 48 %
Platelets: 309 x10E3/uL (ref 150–450)
RBC: 5.1 x10E6/uL (ref 3.77–5.28)
RDW: 11.9 % (ref 11.7–15.4)
WBC: 5 x10E3/uL (ref 3.4–10.8)

## 2024-05-08 LAB — CMP14+EGFR
ALT: 39 IU/L — ABNORMAL HIGH (ref 0–32)
AST: 32 IU/L (ref 0–40)
Albumin: 4.9 g/dL (ref 3.8–4.9)
Alkaline Phosphatase: 74 IU/L (ref 49–135)
BUN/Creatinine Ratio: 17 (ref 9–23)
BUN: 14 mg/dL (ref 6–24)
Bilirubin Total: 0.2 mg/dL (ref 0.0–1.2)
CO2: 24 mmol/L (ref 20–29)
Calcium: 9.9 mg/dL (ref 8.7–10.2)
Chloride: 101 mmol/L (ref 96–106)
Creatinine, Ser: 0.81 mg/dL (ref 0.57–1.00)
Globulin, Total: 1.5 g/dL (ref 1.5–4.5)
Glucose: 108 mg/dL — ABNORMAL HIGH (ref 70–99)
Potassium: 4.7 mmol/L (ref 3.5–5.2)
Sodium: 139 mmol/L (ref 134–144)
Total Protein: 6.4 g/dL (ref 6.0–8.5)
eGFR: 85 mL/min/1.73

## 2024-05-08 LAB — LIPID PANEL
Chol/HDL Ratio: 3.9 ratio (ref 0.0–4.4)
Cholesterol, Total: 197 mg/dL (ref 100–199)
HDL: 50 mg/dL
LDL Chol Calc (NIH): 116 mg/dL — ABNORMAL HIGH (ref 0–99)
Triglycerides: 179 mg/dL — ABNORMAL HIGH (ref 0–149)
VLDL Cholesterol Cal: 31 mg/dL (ref 5–40)

## 2024-05-08 LAB — TSH: TSH: 1.57 u[IU]/mL (ref 0.450–4.500)

## 2024-05-10 LAB — CYTOLOGY - PAP: Diagnosis: NEGATIVE

## 2024-05-14 ENCOUNTER — Ambulatory Visit: Payer: Self-pay | Admitting: Family Medicine

## 2024-06-27 ENCOUNTER — Encounter

## 2024-07-16 ENCOUNTER — Ambulatory Visit (INDEPENDENT_AMBULATORY_CARE_PROVIDER_SITE_OTHER): Admitting: Gastroenterology

## 2024-08-20 ENCOUNTER — Ambulatory Visit (INDEPENDENT_AMBULATORY_CARE_PROVIDER_SITE_OTHER): Admitting: Gastroenterology

## 2024-11-05 ENCOUNTER — Ambulatory Visit: Admitting: Family Medicine

## 2025-05-08 ENCOUNTER — Encounter: Admitting: Family Medicine
